# Patient Record
Sex: Male | Born: 1980 | Race: White | Hispanic: No | Marital: Married | Smoking: Never smoker
Health system: Southern US, Community
[De-identification: ages and names within clinical notes are randomized; demographics above are authoritative.]

## PROBLEM LIST (undated history)

## (undated) DIAGNOSIS — C189 Malignant neoplasm of colon, unspecified: Secondary | ICD-10-CM

## (undated) DIAGNOSIS — B070 Plantar wart: Secondary | ICD-10-CM

## (undated) DIAGNOSIS — Z800 Family history of malignant neoplasm of digestive organs: Secondary | ICD-10-CM

## (undated) DIAGNOSIS — M2142 Flat foot [pes planus] (acquired), left foot: Secondary | ICD-10-CM

## (undated) DIAGNOSIS — Z8042 Family history of malignant neoplasm of prostate: Secondary | ICD-10-CM

## (undated) DIAGNOSIS — M546 Pain in thoracic spine: Secondary | ICD-10-CM

## (undated) DIAGNOSIS — M2141 Flat foot [pes planus] (acquired), right foot: Secondary | ICD-10-CM

## (undated) DIAGNOSIS — E785 Hyperlipidemia, unspecified: Secondary | ICD-10-CM

## (undated) DIAGNOSIS — R0982 Postnasal drip: Secondary | ICD-10-CM

## (undated) DIAGNOSIS — Z8041 Family history of malignant neoplasm of ovary: Secondary | ICD-10-CM

## (undated) DIAGNOSIS — F419 Anxiety disorder, unspecified: Secondary | ICD-10-CM

## (undated) HISTORY — DX: Plantar wart: B07.0

## (undated) HISTORY — DX: Postnasal drip: R09.82

## (undated) HISTORY — DX: Family history of malignant neoplasm of digestive organs: Z80.0

## (undated) HISTORY — DX: Family history of malignant neoplasm of prostate: Z80.42

## (undated) HISTORY — DX: Malignant neoplasm of colon, unspecified: C18.9

## (undated) HISTORY — DX: Anxiety disorder, unspecified: F41.9

## (undated) HISTORY — PX: COLON SURGERY: SHX602

## (undated) HISTORY — DX: Hyperlipidemia, unspecified: E78.5

## (undated) HISTORY — DX: Flat foot (pes planus) (acquired), left foot: M21.42

## (undated) HISTORY — DX: Family history of malignant neoplasm of ovary: Z80.41

## (undated) HISTORY — DX: Pain in thoracic spine: M54.6

## (undated) HISTORY — DX: Flat foot (pes planus) (acquired), right foot: M21.41

## (undated) HISTORY — PX: TONSILLECTOMY AND ADENOIDECTOMY: SUR1326

## (undated) HISTORY — PX: COLONOSCOPY: SHX174

---
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
## 2008-07-06 ENCOUNTER — Encounter: Admission: RE | Admit: 2008-07-06 | Discharge: 2008-07-06 | Payer: Self-pay | Admitting: Internal Medicine

## 2020-02-12 ENCOUNTER — Encounter: Payer: Self-pay | Admitting: Emergency Medicine

## 2020-02-15 ENCOUNTER — Ambulatory Visit: Payer: BC Managed Care – PPO | Admitting: Gastroenterology

## 2020-02-15 ENCOUNTER — Encounter: Payer: Self-pay | Admitting: Gastroenterology

## 2020-02-15 ENCOUNTER — Encounter (INDEPENDENT_AMBULATORY_CARE_PROVIDER_SITE_OTHER): Payer: Self-pay

## 2020-02-15 VITALS — BP 144/62 | HR 90 | Temp 97.7°F | Ht 73.0 in | Wt 225.0 lb

## 2020-02-15 DIAGNOSIS — K6289 Other specified diseases of anus and rectum: Secondary | ICD-10-CM

## 2020-02-15 DIAGNOSIS — R198 Other specified symptoms and signs involving the digestive system and abdomen: Secondary | ICD-10-CM

## 2020-02-15 DIAGNOSIS — R194 Change in bowel habit: Secondary | ICD-10-CM | POA: Diagnosis not present

## 2020-02-15 MED ORDER — NA SULFATE-K SULFATE-MG SULF 17.5-3.13-1.6 GM/177ML PO SOLN: 1.0000 | Freq: Once | ORAL | 0 refills | Status: AC

## 2020-02-15 NOTE — Patient Instructions (Signed)
If you are age 37 or older, your body mass index should be between 23-30. Your Body mass index is 29.69 kg/m. If this is out of the aforementioned range listed, please consider follow up with your Primary Care Provider.  If you are age 10 or younger, your body mass index should be between 19-25. Your Body mass index is 29.69 kg/m. If this is out of the aformentioned range listed, please consider follow up with your Primary Care Provider.   You have been scheduled for a colonoscopy. Please follow written instructions given to you at your visit today.  Please pick up your prep supplies at the pharmacy within the next 1-3 days. If you use inhalers (even only as needed), please bring them with you on the day of your procedure.  START Metamucil or Benefiber OTC as directed by the directions on the bottle.

## 2020-02-15 NOTE — Progress Notes (Signed)
Referring Provider: Shon Baton, MD Primary Care Physician:  Shon Baton, MD  Reason for Consultation:  Rectal fullness   IMPRESSION:  Rectal pain/fullness x 1 year despite fiber Altered bowel habits since symptoms developed - sense of incomplete evacuation    - no blood or mucous No known family history of colon cancer or polyps  Anorectal pain/fullness may be caused by hemorrhoids, anal fissures, rectal polyp, rectocele, rectal cancer, inflammatory bowel disease, chronic benign prostatitis, and coccydynia. Colonoscopy recommended for further evaluation given the non-diagnostic rectal exam today.   PLAN: Add a daily stool bulking agent such as Metamucil or Benefiber Colonoscopy  Please see the "Patient Instructions" section for addition details about the plan.  HPI: Thomas Hunt is a 39 y.o. male referred by Dr. Virgina Jock for rectal fullness. The history is obtained through the patient, review of his electronic health record, and records provided by Dr. Virgina Jock. He has hyperlipidemia, mild anxiety, post-nasal drip, mid thoracic back pain. Data Analyst.   One year history of intermittent rectal spasm. Rectal fullness that is uncomfortable but not painful. Associated muscle spasms whenever he is standing still. Does not happen when sitting, lying, or moving. Not associated with defecation. Bowel movements may be more lose than in the past. No blood or mucous in the stool. No abdominal pain.   No other associated symptoms. Intensity over the year has not changed. Symptoms proceed working from home with Covid.   History of hemorrhoids but feels this is different.  Long-standing history of incomplete evacuation. Denies straining.    Saw Dr. Virgina Jock last January for similar symptoms. Has been using OTC hemorrhoid management over the year.   No known family history of colon cancer or polyps. No family history of IBD. No family history of uterine/endometrial cancer, pancreatic cancer or  gastric/stomach cancer.  Labs 09/07/19: normal CMP and CBC except for an RDW of 11.4. TSH 1.45.   Past Medical History:  Diagnosis Date  . Anxiety   . Flat feet   . Hyperlipidemia   . Midline thoracic back pain   . Plantar warts   . Post-nasal drip     Past Surgical History:  Procedure Laterality Date  . TONSILLECTOMY AND ADENOIDECTOMY      No current outpatient medications on file.   No current facility-administered medications for this visit.    Allergies as of 02/15/2020  . (No Known Allergies)    Family History  Problem Relation Age of Onset  . Hypertension Father   . Prostate cancer Father     Social History   Socioeconomic History  . Marital status: Single    Spouse name: Not on file  . Number of children: Not on file  . Years of education: Not on file  . Highest education level: Not on file  Occupational History  . Not on file  Tobacco Use  . Smoking status: Never Smoker  . Smokeless tobacco: Never Used  Substance and Sexual Activity  . Alcohol use: Yes    Comment: occasionally  . Drug use: Never  . Sexual activity: Not on file  Other Topics Concern  . Not on file  Social History Narrative  . Not on file   Social Determinants of Health   Financial Resource Strain:   . Difficulty of Paying Living Expenses:   Food Insecurity:   . Worried About Charity fundraiser in the Last Year:   . Arboriculturist in the Last Year:   Transportation Needs:   .  Lack of Transportation (Medical):   Marland Kitchen Lack of Transportation (Non-Medical):   Physical Activity:   . Days of Exercise per Week:   . Minutes of Exercise per Session:   Stress:   . Feeling of Stress :   Social Connections:   . Frequency of Communication with Friends and Family:   . Frequency of Social Gatherings with Friends and Family:   . Attends Religious Services:   . Active Member of Clubs or Organizations:   . Attends Archivist Meetings:   Marland Kitchen Marital Status:   Intimate Partner  Violence:   . Fear of Current or Ex-Partner:   . Emotionally Abused:   Marland Kitchen Physically Abused:   . Sexually Abused:     Review of Systems: 12 system ROS is negative except as noted above.   Physical Exam: General:   Alert,  well-nourished, pleasant and cooperative in NAD Head:  Normocephalic and atraumatic. Eyes:  Sclera clear, no icterus.   Conjunctiva pink. Ears:  Normal auditory acuity. Nose:  No deformity, discharge,  or lesions. Mouth:  No deformity or lesions.   Neck:  Supple; no masses or thyromegaly. Lungs:  Clear throughout to auscultation.   No wheezes. Heart:  Regular rate and rhythm; no murmurs. Abdomen:  Soft,nontender, nondistended, normal bowel sounds, no rebound or guarding. No hepatosplenomegaly.   Rectal:   No chemical dermatitis. No external hemorrhoids, fissure or fistula. No prolapsing hemorrhoids. No rectal prolapse. Normal anocutaneous reflex. No stool in the rectal vault but some stool particles present at the anus. No mass or fecal impaction. Normal anal resting tone. Chaperone: Estill Bamberg Msk:  Symmetrical. No boney deformities LAD: No inguinal or umbilical LAD Extremities:  No clubbing or edema. Neurologic:  Alert and  oriented x4;  grossly nonfocal Skin:  Intact without significant lesions or rashes. Psych:  Alert and cooperative. Normal mood and affect.    Gentri Guardado L. Tarri Glenn, MD, MPH 02/15/2020, 9:49 AM

## 2020-03-15 ENCOUNTER — Encounter: Payer: Self-pay | Admitting: Gastroenterology

## 2020-03-15 ENCOUNTER — Other Ambulatory Visit: Payer: Self-pay

## 2020-03-15 ENCOUNTER — Ambulatory Visit (AMBULATORY_SURGERY_CENTER): Payer: BC Managed Care – PPO | Admitting: Gastroenterology

## 2020-03-15 ENCOUNTER — Telehealth: Payer: Self-pay

## 2020-03-15 VITALS — BP 123/68 | HR 85 | Temp 95.9°F | Resp 13 | Ht 73.0 in | Wt 225.0 lb

## 2020-03-15 DIAGNOSIS — K6289 Other specified diseases of anus and rectum: Secondary | ICD-10-CM

## 2020-03-15 DIAGNOSIS — C187 Malignant neoplasm of sigmoid colon: Secondary | ICD-10-CM

## 2020-03-15 DIAGNOSIS — D123 Benign neoplasm of transverse colon: Secondary | ICD-10-CM

## 2020-03-15 DIAGNOSIS — D125 Benign neoplasm of sigmoid colon: Secondary | ICD-10-CM | POA: Diagnosis not present

## 2020-03-15 DIAGNOSIS — R194 Change in bowel habit: Secondary | ICD-10-CM

## 2020-03-15 DIAGNOSIS — K6389 Other specified diseases of intestine: Secondary | ICD-10-CM

## 2020-03-15 MED ORDER — SODIUM CHLORIDE 0.9 % IV SOLN: 500.0000 mL | Freq: Once | INTRAVENOUS | Status: DC

## 2020-03-15 NOTE — Op Note (Signed)
[place] Patient Name: Thomas Hunt Procedure Date: 03/15/2020 12:57 PM MRN: [id number] Endoscopist: Thornton Park MD, MD Age: 39 Referring MD:  Date of Birth: [birth year] Gender: Male Account #: [id number] Procedure:                Colonoscopy Indications:              Change in bowel habits                           Rectal pain/fullness x 1 year despite fiber                           Altered bowel habits since symptoms developed -                            sense of incomplete evacuation                           - no blood or mucous                           No known family history of colon cancer or polyps Medicines:                Monitored Anesthesia Care Procedure:                Pre-Anesthesia Assessment:                           - Prior to the procedure, a History and Physical                            was performed, and patient medications and                            allergies were reviewed. The patient's tolerance of                            previous anesthesia was also reviewed. The risks                            and benefits of the procedure and the sedation                            options and risks were discussed with the patient.                            All questions were answered, and informed consent                            was obtained. Prior Anticoagulants: The patient has                            taken no previous anticoagulant or antiplatelet  agents. ASA Grade Assessment: II - A patient with                            mild systemic disease. After reviewing the risks                            and benefits, the patient was deemed in                            satisfactory condition to undergo the procedure.                           After obtaining informed consent, the colonoscope                            was passed under direct vision. Throughout the                            procedure, the  patient's blood pressure, pulse, and                            oxygen saturations were monitored continuously. The                            Colonoscope was introduced through the anus and                            advanced to the 4 cm into the ileum. A second                            forward view of the right colon was performed. The                            colonoscopy was performed without difficulty. The                            patient tolerated the procedure well. The quality                            of the bowel preparation was good. The terminal                            ileum, ileocecal valve, appendiceal orifice, and                            rectum were photographed. Scope In: 1:29:09 PM Scope Out: 1:51:34 PM Scope Withdrawal Time: 0 hours 19 minutes 44 seconds  Total Procedure Duration: 0 hours 22 minutes 25 seconds  Findings:                 The perianal and digital rectal examinations were                            normal.  Non-bleeding internal hemorrhoids were found.                           A 13 mm polyp was found in the sigmoid colon. The                            polyp was pedunculated. The polyp was removed with                            a hot snare. Resection and retrieval were complete.                            Estimated blood loss was minimal.                           A 2 mm polyp was found in the distal transverse                            colon. The polyp was flat. The polyp was removed                            with a cold snare. Resection and retrieval were                            complete. Estimated blood loss was minimal.                           10 discrete focal areas of erythema and mildly                            altered vascularity and were found in the                            descending colon, in the transverse colon, in the                            ascending colon and in the cecum. Biopsies were                             taken with a cold forceps for histology. Estimated                            blood loss was minimal.                           The exam was otherwise without abnormality on                            direct and retroflexion views. Complications:            No immediate complications. Estimated blood loss:                            Minimal. Estimated  Blood Loss:     Estimated blood loss was minimal. Impression:               - Non-bleeding internal hemorrhoids.                           - One 13 mm polyp in the sigmoid colon, removed                            with a hot snare. Resected and retrieved.                           - One 2 mm polyp in the distal transverse colon,                            removed with a cold snare. Resected and retrieved.                           - Altered vascular and erythematous mucosa in the                            descending colon, in the transverse colon, in the                            ascending colon and in the cecum. Biopsied.                           - The examination was otherwise normal on direct                            and retroflexion views. Recommendation:           - Patient has a contact number available for                            emergencies. The signs and symptoms of potential                            delayed complications were discussed with the                            patient. Return to normal activities tomorrow.                            Written discharge instructions were provided to the                            patient.                           - Resume previous diet.                           - Continue present medications including daily  Metamucil.                           - Await pathology results.                           - Repeat colonoscopy date to be determined after                            pending pathology results are reviewed for                             surveillance.                           - Proceed with anorectal manometry to evaluate the                            rectal fullness.                           - Office follow-up after completing manometry. Thornton Park MD, MD 03/15/2020 2:06:51 PM This report has been signed electronically.

## 2020-03-15 NOTE — Patient Instructions (Signed)
Discharge instructions given. Handouts on polyps and hemorrhoids. Proceed with anorectal manometry to evaluate the rectal fullness. Office follow up after completing manometry. YOU HAD AN ENDOSCOPIC PROCEDURE TODAY AT [facility]:   Refer to the procedure report that was given to you for any specific questions about what was found during the examination.  If the procedure report does not answer your questions, please call your gastroenterologist to clarify.  If you requested that your care partner not be given the details of your procedure findings, then the procedure report has been included in a sealed envelope for you to review at your convenience later.  YOU SHOULD EXPECT: Some feelings of bloating in the abdomen. Passage of more gas than usual.  Walking can help get rid of the air that was put into your GI tract during the procedure and reduce the bloating. If you had a lower endoscopy (such as a colonoscopy or flexible sigmoidoscopy) you may notice spotting of blood in your stool or on the toilet paper. If you underwent a bowel prep for your procedure, you may not have a normal bowel movement for a few days.  Please Note:  You might notice some irritation and congestion in your nose or some drainage.  This is from the oxygen used during your procedure.  There is no need for concern and it should clear up in a day or so.  SYMPTOMS TO REPORT IMMEDIATELY:   Following lower endoscopy (colonoscopy or flexible sigmoidoscopy):  Excessive amounts of blood in the stool  Significant tenderness or worsening of abdominal pains  Swelling of the abdomen that is new, acute  Fever of 100F or higher   For urgent or emergent issues, a gastroenterologist can be reached at any hour by calling [phone]. Do not use MyChart messaging for urgent concerns.    DIET:  We do recommend a small meal at first, but then you may proceed to your regular diet.  Drink plenty of fluids but you  should avoid alcoholic beverages for 24 hours.  ACTIVITY:  You should plan to take it easy for the rest of today and you should NOT DRIVE or use heavy machinery until tomorrow (because of the sedation medicines used during the test).    FOLLOW UP: Our staff will call the number listed on your records 48-72 hours following your procedure to check on you and address any questions or concerns that you may have regarding the information given to you following your procedure. If we do not reach you, we will leave a message.  We will attempt to reach you two times.  During this call, we will ask if you have developed any symptoms of COVID 19. If you develop any symptoms (ie: fever, flu-like symptoms, shortness of breath, cough etc.) before then, please call [phone].  If you test positive for Covid 19 in the 2 weeks post procedure, please call and report this information to [place].    If any biopsies were taken you will be contacted by phone or by letter within the next 1-3 weeks.  Please call us at [phone] if you have not heard about the biopsies in 3 weeks.    SIGNATURES/CONFIDENTIALITY: You and/or your care partner have signed paperwork which will be entered into your electronic medical record.  These signatures attest to the fact that that the information above on your After Visit Summary has been reviewed and is understood.  Full responsibility of the confidentiality of this discharge information lies  with you and/or your care-partner.

## 2020-03-15 NOTE — Progress Notes (Signed)
Called to room to assist during endoscopic procedure.  Patient ID and intended procedure confirmed with present staff. Received instructions for my participation in the procedure from the performing physician.  

## 2020-03-15 NOTE — Telephone Encounter (Signed)
-----   Message from Thornton Park, MD sent at 03/15/2020  2:06 PM EDT ----- Please place referral for anorectal manometry: rectal fullness not explained by colonoscopy Needs office visit with me 7-14 days after manometry.  Thank you.  KLB

## 2020-03-15 NOTE — Progress Notes (Signed)
Pt Drowsy. VSS. To PACU, report to RN. No anesthetic complications noted.  

## 2020-03-15 NOTE — Progress Notes (Signed)
JB- temp CW- vitals 

## 2020-03-16 ENCOUNTER — Other Ambulatory Visit: Payer: Self-pay

## 2020-03-16 DIAGNOSIS — K6289 Other specified diseases of anus and rectum: Secondary | ICD-10-CM

## 2020-03-16 NOTE — Telephone Encounter (Signed)
Dear Mr. Westland,  You are scheduled for covid testing at the [facility]-on Saturday May 15 at 10am prior to your procedure at the hospital. You will need to quarantine the days leading up to your procedure.  You have been scheduled to have an anorectal manometry at [facility] on 04/20/20 at 12:30pm. Please arrive 30 minutes prior to your appointment time for registration (1st floor of the hospital-admissions).  Please make certain to use 1 Fleets enema 2 hours prior to coming for your appointment. You can purchase Fleets enemas from the laxative section at your drug store. You should not eat anything during the two hours prior to the procedure. You may take regular medications with small sips of water at least 2 hours prior to the study.  Anorectal manometry is a test performed to evaluate patients with constipation or fecal incontinence. This test measures the pressures of the anal sphincter muscles, the sensation in the rectum, and the neural reflexes that are needed for normal bowel movements.  THE PROCEDURE The test takes approximately 30 minutes to 1 hour. You will be asked to change into a hospital gown. A technician or nurse will explain the procedure to you, take a brief health history, and answer any questions you may have. The patient then lies on his or her left side. A small, flexible tube, about the size of a thermometer, with a balloon at the end is inserted into the rectum. The catheter is connected to a machine that measures the pressure. During the test, the small balloon attached to the catheter may be inflated in the rectum to assess the normal reflex pathways. The nurse or technician may also ask the person to squeeze, relax, and push at various times. The anal sphincter muscle pressures are measured during each of these maneuvers. To squeeze, the patient tightens the sphincter muscles as if trying to prevent anything from coming out. To push  or bear down, the patient strains down as if trying to have a bowel movement.  You are scheduled to see Dr. Tarri Glenn in the office 05/10/20 at 1:50pm.  Sincerely, [facility]   Pt mailed appt letter.

## 2020-03-17 ENCOUNTER — Telehealth: Payer: Self-pay | Admitting: *Deleted

## 2020-03-17 NOTE — Telephone Encounter (Signed)
  Follow up Call-  Call back number 03/15/2020  Post procedure Call Back phone  # [phone]  Permission to leave phone message Yes  Some recent data might be hidden     Patient questions:  Do you have a fever, pain , or abdominal swelling? No. Pain Score  0 *  Have you tolerated food without any problems? Yes.    Have you been able to return to your normal activities? Yes.    Do you have any questions about your discharge instructions: Diet   No. Medications  No. Follow up visit  No.  Do you have questions or concerns about your Care? No.  Actions: * If pain score is 4 or above: No action needed, pain <4.  1. Have you developed a fever since your procedure? no  2.   Have you had an respiratory symptoms (SOB or cough) since your procedure? no  3.   Have you tested positive for COVID 19 since your procedure no  4.   Have you had any family members/close contacts diagnosed with the COVID 19 since your procedure?  no   If yes to any of these questions please route to Joylene John, RN and Erenest Rasher, RN

## 2020-03-17 NOTE — Telephone Encounter (Signed)
  Follow up Call-  Call back number 03/15/2020  Post procedure Call Back phone  # [phone]  Permission to leave phone message Yes  Some recent data might be hidden   [facility]

## 2020-03-22 ENCOUNTER — Other Ambulatory Visit: Payer: Self-pay

## 2020-03-22 DIAGNOSIS — C801 Malignant (primary) neoplasm, unspecified: Secondary | ICD-10-CM

## 2020-03-23 ENCOUNTER — Other Ambulatory Visit: Payer: Self-pay

## 2020-03-23 ENCOUNTER — Encounter: Payer: Self-pay | Admitting: Gastroenterology

## 2020-03-23 ENCOUNTER — Ambulatory Visit (AMBULATORY_SURGERY_CENTER): Payer: BC Managed Care – PPO | Admitting: Gastroenterology

## 2020-03-23 VITALS — BP 125/79 | HR 87 | Temp 96.6°F | Resp 15 | Ht 73.0 in | Wt 225.0 lb

## 2020-03-23 DIAGNOSIS — K635 Polyp of colon: Secondary | ICD-10-CM

## 2020-03-23 DIAGNOSIS — C187 Malignant neoplasm of sigmoid colon: Secondary | ICD-10-CM | POA: Diagnosis not present

## 2020-03-23 DIAGNOSIS — K5520 Angiodysplasia of colon without hemorrhage: Secondary | ICD-10-CM

## 2020-03-23 DIAGNOSIS — K633 Ulcer of intestine: Secondary | ICD-10-CM

## 2020-03-23 DIAGNOSIS — C801 Malignant (primary) neoplasm, unspecified: Secondary | ICD-10-CM

## 2020-03-23 DIAGNOSIS — I420 Dilated cardiomyopathy: Secondary | ICD-10-CM

## 2020-03-23 MED ORDER — SODIUM CHLORIDE 0.9 % IV SOLN: 500.0000 mL | Freq: Once | INTRAVENOUS | Status: DC

## 2020-03-23 NOTE — Progress Notes (Signed)
Pt Drowsy. VSS. To PACU, report to RN. No anesthetic complications noted.  

## 2020-03-23 NOTE — Progress Notes (Signed)
Pt's states no medical or surgical changes since previsit or office visit.  CW vitals, KW IV and LC temp.

## 2020-03-23 NOTE — Op Note (Addendum)
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 Carcinoma present 0.15mm from                            the margins. No lymphovascular invasion.                           Repeat exam recommended to further evaluate the                            polyp base and tattooing. Medicines:                Monitored Anesthesia Care Procedure:                Pre-Anesthesia Assessment:                           - Prior to the procedure, a History and Physical                            was performed, and patient medications and                            allergies were reviewed. The patient's tolerance of                            previous anesthesia was also reviewed. The risks                            and benefits of the procedure and the sedation                            options and risks were discussed with the patient.                            All questions were answered, and informed consent                            was obtained. Prior Anticoagulants: The patient has  taken no previous anticoagulant or antiplatelet                            agents. ASA Grade Assessment: II - A patient with                            mild systemic disease. After reviewing the risks                            and benefits, the patient was deemed in                            satisfactory condition to undergo the procedure.                           After obtaining informed consent,  the scope was                            passed under direct vision. The Colonoscope was                            introduced through the anus and advanced to the the                            left transverse colon. The flexible sigmoidoscopy                            was accomplished without difficulty except for                            looping in the left colon. The patient tolerated                            the procedure well. The quality of the bowel                            preparation was good. Scope In: 10:36:23 AM Scope Out: 10:52:50 AM Total Procedure Duration: 0 hours 16 minutes 27 seconds  Findings:                 The perianal and digital rectal examinations were                            normal.                           Non-bleeding internal hemorrhoids were found. The                            hemorrhoids were small.                           A polypectomy site with associated ulceration was  identified in the distal descending colon. No                            obvious residual polyp or cancer identified. The                            location was 58cm from the anal verge. The margins                            of the ulcer were biopsied for histology. Area was                            tattooed with an injection of 3 mL of Spot (carbon                            black).                           A single small angioectasia without bleeding was                            found in the descending colon.                           The exam was otherwise without abnormality. No                            other mucosal abnormalities identified. Complications:            No immediate complications. Estimated blood loss:                            Minimal. Estimated Blood Loss:     Estimated blood loss was minimal. Impression:               - Non-bleeding internal hemorrhoids.                           - Location of prior polyp with  adenocarcinoma                            identified. Accurate location is the descending                            colon. Biopsied. Tattooed.                           - A single non-bleeding colonic angioectasia.                           - The examination was otherwise normal. Recommendation:           - Discharge patient to home.                           - Resume previous diet.                           -  Continue present medications.                           - Await pathology results.                           - CT of the abd/pelvis with contrast recommended.                           - Consultation with Dr. Dema Severin, [facility]                            Surgery.                           - Consider genetic counseling. All first degree                            family members (children, brothers, sisters, and                            parents) should start colon cancer screening early                            given these findings. Thornton Park MD, MD 03/23/2020 11:12:25 AM This report has been signed electronically.

## 2020-03-23 NOTE — Progress Notes (Signed)
Called to room to assist during endoscopic procedure.  Patient ID and intended procedure confirmed with present staff. Received instructions for my participation in the procedure from the performing physician.  

## 2020-03-23 NOTE — Patient Instructions (Signed)
Please read handouts provided. Continue present medications. Await pathology results. CT of the abd/pelvis with contrast recommended. Consultation with Dr. Dema Severin, [facility]. Consider genetic counseling.     YOU HAD AN ENDOSCOPIC PROCEDURE TODAY AT [facility]:   Refer to the procedure report that was given to you for any specific questions about what was found during the examination.  If the procedure report does not answer your questions, please call your gastroenterologist to clarify.  If you requested that your care partner not be given the details of your procedure findings, then the procedure report has been included in a sealed envelope for you to review at your convenience later.  YOU SHOULD EXPECT: Some feelings of bloating in the abdomen. Passage of more gas than usual.  Walking can help get rid of the air that was put into your GI tract during the procedure and reduce the bloating. If you had a lower endoscopy (such as a colonoscopy or flexible sigmoidoscopy) you may notice spotting of blood in your stool or on the toilet paper. If you underwent a bowel prep for your procedure, you may not have a normal bowel movement for a few days.  Please Note:  You might notice some irritation and congestion in your nose or some drainage.  This is from the oxygen used during your procedure.  There is no need for concern and it should clear up in a day or so.  SYMPTOMS TO REPORT IMMEDIATELY:   Following lower endoscopy (colonoscopy or flexible sigmoidoscopy):  Excessive amounts of blood in the stool  Significant tenderness or worsening of abdominal pains  Swelling of the abdomen that is new, acute  Fever of 100F or higher   For urgent or emergent issues, a gastroenterologist can be reached at any hour by calling [phone]. Do not use MyChart messaging for urgent concerns.    DIET:  We do recommend a small meal at first, but then you may proceed to your  regular diet.  Drink plenty of fluids but you should avoid alcoholic beverages for 24 hours.  ACTIVITY:  You should plan to take it easy for the rest of today and you should NOT DRIVE or use heavy machinery until tomorrow (because of the sedation medicines used during the test).    FOLLOW UP: Our staff will call the number listed on your records 48-72 hours following your procedure to check on you and address any questions or concerns that you may have regarding the information given to you following your procedure. If we do not reach you, we will leave a message.  We will attempt to reach you two times.  During this call, we will ask if you have developed any symptoms of COVID 19. If you develop any symptoms (ie: fever, flu-like symptoms, shortness of breath, cough etc.) before then, please call [phone].  If you test positive for Covid 19 in the 2 weeks post procedure, please call and report this information to [place].    If any biopsies were taken you will be contacted by phone or by letter within the next 1-3 weeks.  Please call us at [phone] if you have not heard about the biopsies in 3 weeks.    SIGNATURES/CONFIDENTIALITY: You and/or your care partner have signed paperwork which will be entered into your electronic medical record.  These signatures attest to the fact that that the information above on your After Visit Summary has been reviewed and is understood.  Full responsibility  of the confidentiality of this discharge information lies with you and/or your care-partner.

## 2020-03-24 ENCOUNTER — Telehealth: Payer: Self-pay

## 2020-03-24 ENCOUNTER — Other Ambulatory Visit: Payer: Self-pay

## 2020-03-24 DIAGNOSIS — C801 Malignant (primary) neoplasm, unspecified: Secondary | ICD-10-CM

## 2020-03-24 NOTE — Telephone Encounter (Signed)
Pt scheduled for CT of A/P at [facility] 03/30/20@1 :30pm, pt to arrive there at 1:15pm and be NPO after 9:30am. Pt to drink bottle 1 of contrast at 11:30am and bottle 2 T 12:30pm. Pt aware of appt and instructions.

## 2020-03-24 NOTE — Telephone Encounter (Signed)
Referral faxed to [facility] for an appt with Dr. Dema Severin.

## 2020-03-25 ENCOUNTER — Telehealth: Payer: Self-pay

## 2020-03-25 NOTE — Telephone Encounter (Signed)
  Follow up Call-  Call back number 03/23/2020 03/15/2020  Post procedure Call Back phone  # [phone]  Permission to leave phone message Yes Yes  Some recent data might be hidden     Patient questions:  Do you have a fever, pain , or abdominal swelling? No. Pain Score  0   Have you tolerated food without any problems? Yes.    Have you been able to return to your normal activities? Yes.    Do you have any questions about your discharge instructions: Diet   No. Medications  No. Follow up visit  No.  Do you have questions or concerns about your Care? No.  Actions: * If pain score is 4 or above: No action needed, pain <4.  1. Have you developed a fever since your procedure? no  2.   Have you had an respiratory symptoms (SOB or cough) since your procedure? no  3.   Have you tested positive for COVID 19 since your procedure no  4.   Have you had any family members/close contacts diagnosed with the COVID 19 since your procedure?  no   If yes to any of these questions please route to Joylene John, RN and Erenest Rasher, RN

## 2020-03-26 HISTORY — PX: SIGMOIDOSCOPY: SHX6686

## 2020-03-28 ENCOUNTER — Other Ambulatory Visit: Payer: BC Managed Care – PPO | Admitting: Gastroenterology

## 2020-03-30 ENCOUNTER — Ambulatory Visit (INDEPENDENT_AMBULATORY_CARE_PROVIDER_SITE_OTHER)
Admission: RE | Admit: 2020-03-30 | Discharge: 2020-03-30 | Disposition: A | Discharge status: Home / Self Care | Payer: BC Managed Care – PPO | Source: Ambulatory Visit | Attending: Gastroenterology | Admitting: Gastroenterology

## 2020-03-30 ENCOUNTER — Other Ambulatory Visit: Payer: Self-pay

## 2020-03-30 DIAGNOSIS — C801 Malignant (primary) neoplasm, unspecified: Secondary | ICD-10-CM

## 2020-03-30 MED ORDER — IOHEXOL 300 MG/ML  SOLN
100.0000 mL | Freq: Once | INTRAMUSCULAR | Status: AC | PRN
  Administered 2020-03-30: 100 mL via INTRAVENOUS

## 2020-04-07 ENCOUNTER — Other Ambulatory Visit: Payer: Self-pay | Admitting: Surgery

## 2020-04-07 DIAGNOSIS — C801 Malignant (primary) neoplasm, unspecified: Secondary | ICD-10-CM

## 2020-04-08 ENCOUNTER — Telehealth: Payer: Self-pay

## 2020-04-08 NOTE — Telephone Encounter (Signed)
Recall in epic for colon in 1 year.

## 2020-04-11 ENCOUNTER — Telehealth: Payer: Self-pay | Admitting: Genetic Counselor

## 2020-04-11 NOTE — Telephone Encounter (Signed)
Received a genetic counseling referral from Dr. Dema Severin at [facility] for colon cancer. Thomas Hunt has been cld and scheduled to see Roma Kayser on 5/17 at 2pm. Pt aware to arrive 15 minutes early.

## 2020-04-14 ENCOUNTER — Ambulatory Visit: Payer: Self-pay | Admitting: Surgery

## 2020-04-14 NOTE — H&P (Signed)
CC: Referred by Dr. Tarri Hunt for malignant polyp  HPI: Thomas Hunt is a very pleasant 39yoM who has a couple year history of vague abdominal/pelvic complaints. He reports ongoing issues with some degree of rectal fullness and incomplete defecation as well as a sense of a muscle twitching in his pelvic floor. He had been seen in gastroenterology, Dr. Tarri Hunt and ultimately had a colonoscopy done. He denied any history of rectal bleeding. Colonoscopy is completed 03/15/20 and demonstrated a 13 mm polyp in the sigmoid colon that was semi-pedunculated and removed with a hot snare. Resection was complete. A 2 mm polyp in the distal transverse colon removed and additionally, 10 discrete focal areas of erythema and mildly altered vascularity were seen and biopsied. 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 Transverse colon polyp return as a tubular adenoma. Biopsies showed mucosa with focus of ectatic vascular channels, mostly capillaries. Carcinoma measured 4 mm. There is no lymphovascular invasion noted. Carcinoma is present approximately 0.5 mm from the inked margin. IHC/mismatch repair protein normal. He had a subsequent CT abdomen and pelvis which demonstrated no evidence of metastatic disease. He has not completed a CT scan of his chest. He has not had a genetics referral. He had a repeat flexible sigmoidoscopy done 03/23/20 where the lesion was seen as an ulcer at 58 cm from anal verge and was tattooed. Biopsies were taken and returned normal. He denies any complaints today. He denies abdominal pain. He denies any blood in stool.  PMH: Denies  PSH: Denies  FHx: Denies FHx of colorectal, breast, endometrial, ovarian or cervical cancer  Social: Denies use of tobacco/drugs; occasional social EtOH use  ROS: A comprehensive 10 system review of systems was completed with the patient and  pertinent findings as noted above.  The patient is a 39 year old male.   Past Surgical History Thomas Hunt, Hunt; 04/05/2020 9:08 AM) Colon Polyp Removal - Colonoscopy  Tonsillectomy   Diagnostic Studies History Thomas Hunt, Hunt; 04/05/2020 9:08 AM) Colonoscopy  within last year  Allergies Thomas Hunt, Hunt; 04/05/2020 9:08 AM) No Known Drug Allergies  [04/05/2020]: Allergies Reconciled   Medication History Thomas Hunt; 04/05/2020 9:08 AM) No Current Medications Medications Reconciled  Social History Thomas Hunt, Hunt; 04/05/2020 9:08 AM) Alcohol use  Occasional alcohol use. Caffeine use  Carbonated beverages, Coffee, Tea. No drug use  Tobacco use  Never smoker.  Family History Thomas Hunt, Hunt; 04/05/2020 9:08 AM) Prostate Cancer  Father.  Other Problems Thomas Hunt, Hunt; 04/05/2020 9:08 AM) Anxiety Disorder  Colon Cancer  Hemorrhoids     Review of Systems (Thomas Hunt Hunt; 04/05/2020 9:08 AM) General Not Present- Appetite Loss, Chills, Fatigue, Fever, Night Sweats, Weight Gain and Weight Loss. Skin Not Present- Change in Wart/Mole, Dryness, Hives, Jaundice, New Lesions, Non-Healing Wounds, Rash and Ulcer. HEENT Not Present- Earache, Hearing Loss, Hoarseness, Nose Bleed, Oral Ulcers, Ringing in the Ears, Seasonal Allergies, Sinus Pain, Sore Throat, Visual Disturbances, Wears glasses/contact lenses and Yellow Eyes. Respiratory Not Present- Bloody sputum, Chronic Cough, Difficulty Breathing, Snoring and Wheezing. Breast Not Present- Breast Mass, Breast Pain, Nipple Discharge and Skin Changes. Cardiovascular Not Present- Chest Pain, Difficulty Breathing Lying Down, Leg Cramps, Palpitations, Rapid Heart Rate, Shortness of Breath and Swelling of Extremities. Gastrointestinal Not Present- Abdominal Pain, Bloating, Bloody Stool, Change in Bowel Habits, Chronic diarrhea, Constipation, Difficulty Swallowing, Excessive  gas, Gets full quickly at meals, Hemorrhoids, Indigestion, Nausea, Rectal Pain and Vomiting. Male  Genitourinary Not Present- Blood in Urine, Change in Urinary Stream, Frequency, Impotence, Nocturia, Painful Urination, Urgency and Urine Leakage. Musculoskeletal Not Present- Back Pain, Joint Pain, Joint Stiffness, Muscle Pain, Muscle Weakness and Swelling of Extremities. Neurological Not Present- Decreased Memory, Fainting, Headaches, Numbness, Seizures, Tingling, Tremor, Trouble walking and Weakness. Psychiatric Not Present- Anxiety, Bipolar, Change in Sleep Pattern, Depression, Fearful and Frequent crying. Endocrine Not Present- Cold Intolerance, Excessive Hunger, Hair Changes, Heat Intolerance, Hot flashes and New Diabetes. Hematology Not Present- Blood Thinners, Easy Bruising, Excessive bleeding, Gland problems, HIV and Persistent Infections.  Vitals Thomas Hunt; 04/05/2020 9:08 AM) 04/05/2020 9:08 AM Weight: 217.6 lb Height: 73in Body Surface Area: 2.23 m Body Mass Index: 28.71 kg/m  Temp.: 98.65F (Temporal)  Pulse: 99 (Regular)  P.OX: 99% (Room air) BP: 132/80(Sitting, Right Arm, Standard)       Physical Exam Thomas Gave M. White MD; 04/05/2020 9:44 AM) The physical exam findings are as follows: Note: Constitutional: No acute distress; conversant; no deformities; wearing mask Eyes: Moist conjunctiva; no lid lag; anicteric sclerae; pupils equal and round Neck: Trachea midline; no palpable thyromegaly Lungs: Normal respiratory effort; no tactile fremitus CV: rrr; no palpable thrill; no pitting edema GI: Abdomen soft, nontender, nondistended; no palpable hepatosplenomegaly MSK: Normal gait; no clubbing/cyanosis Psychiatric: Appropriate affect; alert and oriented 3 Lymphatic: No palpable cervical or axillary lymphadenopathy    Assessment & Plan Thomas Gave M. White MD; 04/05/2020 9:50 AM) ADENOCARCINOMA (C80.1) Current Plans CT CHEST W/ AND W/O CONTRAST  (76811) Referred to Genetic Counseling, for evaluation and follow up Thomas Hunt). Routine. COLON CANCER (C18.9) Story: Thomas Hunt is a very pleasant 62yoM with recently removed polyp from colon - presumed sigmoid - returned well differentiated adenocarcinoma with 0.5 mm margin; no LVI.  CT A/P 03/30/20 - no evidence of metastatic disease  -The anatomy and physiology of the GI tract was discussed at length with the patient. The pathophysiology of colon polyps and cancer was discussed at length with associated pictures. -We spent time going over his pathology with him today. We discussed that a CT scan of the chest negative completed as well as a genetics referral.  -We discussed that with a margin of 0.5 mm, risk of recurrence/relapse being between approximately 20 and 34% depending on the study. We discussed that with negative biopsy results from the flexible sigmoidoscopy, this being somewhat uncharted territory. We did review recommendation for surgery going forward given the fact that the margin was so close and if this were to "recur" (persist), by the time it is endoscopically apparent, high risks for lymphatic spread and subsequent metastases. We therefore also spent time today reviewing segmental colectomy to address the area of concern. Impression: We discussed that this may involve a sigmoid colectomy or left hemicolectomy depending on where the tattoo was located intraoperatively. We discussed minimally invasive/robotic and potential open techniques. -We also discussed that if he were to have a genetics panel returned positive for a colon cancer such as leg syndrome options for prophylactic surgery including total abdominal colectomy. He is not interested in pursuing any sort of "prophylactic surgery" acknowledges potential risks therein; intense surveillance. -The procedure, material risks (including, but not limited to, pain, bleeding, infection, scarring, need for blood transfusion,  damage to surrounding structures- blood vessels/nerves/viscus/organs, damage to ureter, urine leak, leak from anastomosis, need for additional procedures, medication reactions, worsening of pre-existing medical conditions, need for stoma which can be permanent, hernia, recurrence, DVT/PE, pneumonia, heart attack, stroke, death) benefits and alternatives to surgery were  discussed at length. The patient's questions were answered to his satisfaction, he voiced understanding. Additionally, we discussed typical postoperative expectations and the recovery process. -He is planning to speak with his wife and let us know if next week what his decision is  This patient encounter took 52 minutes today to perform the following: take history, perform exam, review outside records, interpret imaging, counsel the patient on their diagnosis and document encounter, findings & plan in the EHR  Signed by Ileana Roup, MD (04/05/2020 9:50 AM)

## 2020-04-16 ENCOUNTER — Other Ambulatory Visit (HOSPITAL_COMMUNITY): Payer: BC Managed Care – PPO

## 2020-04-18 ENCOUNTER — Inpatient Hospital Stay: Payer: BC Managed Care – PPO | Attending: Genetic Counselor | Admitting: Genetic Counselor

## 2020-04-18 ENCOUNTER — Encounter: Payer: Self-pay | Admitting: Genetic Counselor

## 2020-04-18 ENCOUNTER — Other Ambulatory Visit: Payer: Self-pay | Admitting: Genetic Counselor

## 2020-04-18 ENCOUNTER — Other Ambulatory Visit: Payer: Self-pay

## 2020-04-18 ENCOUNTER — Inpatient Hospital Stay: Payer: BC Managed Care – PPO

## 2020-04-18 DIAGNOSIS — Z1379 Encounter for other screening for genetic and chromosomal anomalies: Secondary | ICD-10-CM

## 2020-04-18 DIAGNOSIS — C189 Malignant neoplasm of colon, unspecified: Secondary | ICD-10-CM

## 2020-04-18 DIAGNOSIS — Z800 Family history of malignant neoplasm of digestive organs: Secondary | ICD-10-CM

## 2020-04-18 DIAGNOSIS — Z8041 Family history of malignant neoplasm of ovary: Secondary | ICD-10-CM

## 2020-04-18 DIAGNOSIS — Z8042 Family history of malignant neoplasm of prostate: Secondary | ICD-10-CM

## 2020-04-18 LAB — GENETIC SCREENING ORDER

## 2020-04-18 NOTE — Progress Notes (Signed)
REFERRING PROVIDER: Ileana Roup, MD [address] [place],  [place] [address]  PRIMARY PROVIDER:  Shon Baton, MD  PRIMARY REASON FOR VISIT:  1. Family history of colon cancer   2. Family history of prostate cancer   3. Family history of ovarian cancer   4. Malignant neoplasm of colon, unspecified part of colon ([place])      HISTORY OF PRESENT ILLNESS:   [name], a 39 y.o. male, was seen for a [facility] cancer genetics consultation at the request of Dr. Dema Severin due to a personal and family history of cancer.  [name] presents to clinic today to discuss the possibility of a hereditary predisposition to cancer, genetic testing, and to further clarify his future cancer risks, as well as potential cancer risks for family members.   In April 2021, at the age of 4, Mr. Hunt was diagnosed with adenocarcinoma of the colon. The treatment plan polyp removal, as the cancer was in the polyp.  He had a lower body CT scan that was normal and is scheduled for an upper body CT next week.      CANCER HISTORY:  Oncology History   No history exists.      Past Medical History:  Diagnosis Date  . Anxiety   . Colon cancer ([place])   . Family history of colon cancer   . Family history of ovarian cancer   . Family history of prostate cancer   . Flat feet   . Hyperlipidemia   . Midline thoracic back pain   . Plantar warts   . Post-nasal drip     Past Surgical History:  Procedure Laterality Date  . TONSILLECTOMY AND ADENOIDECTOMY      Social History   Socioeconomic History  . Marital status: Single    Spouse name: Not on file  . Number of children: Not on file  . Years of education: Not on file  . Highest education level: Not on file  Occupational History  . Not on file  Tobacco Use  . Smoking status: Never Smoker  . Smokeless tobacco: Never Used  Substance and Sexual Activity  . Alcohol use: Yes    Comment: occasionally  . Drug use: Never  . Sexual activity: Not on  file  Other Topics Concern  . Not on file  Social History Narrative  . Not on file   Social Determinants of Health   Financial Resource Strain:   . Difficulty of Paying Living Expenses:   Food Insecurity:   . Worried About Charity fundraiser in the Last Year:   . Arboriculturist in the Last Year:   Transportation Needs:   . Film/video editor (Medical):   Marland Kitchen Lack of Transportation (Non-Medical):   Physical Activity:   . Days of Exercise per Week:   . Minutes of Exercise per Session:   Stress:   . Feeling of Stress :   Social Connections:   . Frequency of Communication with Friends and Family:   . Frequency of Social Gatherings with Friends and Family:   . Attends Religious Services:   . Active Member of Clubs or Organizations:   . Attends Archivist Meetings:   Marland Kitchen Marital Status:      FAMILY HISTORY:  We obtained a detailed, 4-generation family history.  Significant diagnoses are listed below: Family History  Problem Relation Age of Onset  . Hypertension Father   . Prostate cancer Father 60  . Colonic polyp  Mother   . Prostate cancer Maternal Grandfather 28  . Lung cancer Maternal Grandfather 61       smoker  . Ovarian cancer Paternal Grandmother        fallopian tube cancer  . Prostate cancer Paternal Grandfather        d. 30  . Colon cancer Other        MGF's brother dx over 83  . Stomach cancer Neg Hx   . Rectal cancer Neg Hx   . Diabetes Neg Hx     The patient has a young son and daughter who are cancer free.  He has a sister who is cancer free.  Both parents are living and do not have cancer.  The patient's mother is 67 and has had a colonoscopy that found 1-2 polyps.  She has four brothers who are cancer free.  Her parents are deceased.  Her father had prostate and lung cancer and his brother had colon cancer over 18.  The patient's father had prostate cancer at 28.  He is an only child.  His mother had fallopian tube cancer and his father  also had prostate cancer.  [name] is unaware of previous family history of genetic testing for hereditary cancer risks. Patient's maternal ancestors are of Caucasian descent, and paternal ancestors are of Caucasian descent. There is no reported Ashkenazi Jewish ancestry. There is no known consanguinity.  GENETIC COUNSELING ASSESSMENT: [name] is a 39 y.o. male with a personal and family history of cancer which is somewhat suggestive of a hereditary cancer syndrome, such as Lynch syndrome, and predisposition to cancer given his young age of onset and the combination of cancer in the family. We, therefore, discussed and recommended the following at today's visit.   DISCUSSION: We discussed that 5 - 7% of colon cancer is hereditary, with most cases associated with Lynch syndrome.  There are other genes that can be associated with hereditary colon cancer syndromes.  These include APC, MUTYH and others.  We discussed that testing is beneficial for several reasons including knowing how to follow individuals after completing their treatment, identifying whether potential treatment options such as systemic therapy if needed would be beneficial, and understand if other family members could be at risk for cancer and allow them to undergo genetic testing.   We reviewed the characteristics, features and inheritance patterns of hereditary cancer syndromes. We also discussed genetic testing, including the appropriate family members to test, the process of testing, insurance coverage and turn-around-time for results. We discussed the implications of a negative, positive, carrier and/or variant of uncertain significant result. We recommended Mr. Droge pursue genetic testing for the common hereditary cancer panel and preliminary colon cancer gene panel.   Based on [name] personal and family history of cancer, he meets medical criteria for genetic testing. Despite that he meets criteria, he may still have an  out of pocket cost. We discussed that if his out of pocket cost for testing is over $100, the laboratory will call and confirm whether he wants to proceed with testing.  If the out of pocket cost of testing is less than $100 he will be billed by the genetic testing laboratory.   PLAN: After considering the risks, benefits, and limitations, Mr. Plumber provided informed consent to pursue genetic testing and the blood sample was sent to [facility] for analysis of the common hereditary cancer panel and the preliminary colon cancer panel. Results should be available within approximately 2-3 weeks'  time, at which point they will be disclosed by telephone to Mr. Haataja, as will any additional recommendations warranted by these results. Mr. Neudecker will receive a summary of his genetic counseling visit and a copy of his results once available. This information will also be available in Epic.   Lastly, we encouraged Mr. Woulard to remain in contact with cancer genetics annually so that we can continuously update the family history and inform him of any changes in cancer genetics and testing that may be of benefit for this family.   Mr. Southards questions were answered to his satisfaction today. Our contact information was provided should additional questions or concerns arise. Thank you for the referral and allowing [place] to share in the care of your patient.   Adira Limburg P. Florene Glen, [place], [facility] Licensed, Insurance risk surveyor [email or web address] phone: [phone]  The patient was seen for a total of 45 minutes in face-to-face genetic counseling.  This patient was discussed with Drs. Magrinat, Lindi Adie and/or Burr Medico who agrees with the above.    _______________________________________________________________________ For Office Staff:  Number of people involved in session: 1 Was an Intern/ student involved with case: no

## 2020-04-20 ENCOUNTER — Encounter (HOSPITAL_COMMUNITY): Payer: Self-pay

## 2020-04-20 ENCOUNTER — Ambulatory Visit (HOSPITAL_COMMUNITY): Admit: 2020-04-20 | Payer: BC Managed Care – PPO | Admitting: Gastroenterology

## 2020-04-20 SURGERY — MANOMETRY, ANORECTAL
Anesthesia: LOCAL

## 2020-04-22 ENCOUNTER — Other Ambulatory Visit: Payer: Self-pay

## 2020-04-22 ENCOUNTER — Ambulatory Visit
Admission: RE | Admit: 2020-04-22 | Discharge: 2020-04-22 | Disposition: A | Discharge status: Home / Self Care | Payer: BC Managed Care – PPO | Source: Ambulatory Visit | Attending: Surgery | Admitting: Surgery

## 2020-04-22 DIAGNOSIS — C801 Malignant (primary) neoplasm, unspecified: Secondary | ICD-10-CM

## 2020-04-22 MED ORDER — IOPAMIDOL (ISOVUE-300) INJECTION 61%
75.0000 mL | Freq: Once | INTRAVENOUS | Status: AC | PRN
  Administered 2020-04-22: 75 mL via INTRAVENOUS

## 2020-04-27 ENCOUNTER — Encounter: Payer: Self-pay | Admitting: Genetic Counselor

## 2020-04-27 ENCOUNTER — Ambulatory Visit: Payer: Self-pay | Admitting: Genetic Counselor

## 2020-04-27 ENCOUNTER — Telehealth: Payer: Self-pay | Admitting: Genetic Counselor

## 2020-04-27 DIAGNOSIS — Z1379 Encounter for other screening for genetic and chromosomal anomalies: Secondary | ICD-10-CM | POA: Insufficient documentation

## 2020-04-27 NOTE — Telephone Encounter (Signed)
Revealed negative genetic testing.  Discussed that we do not know why he has colon cancer or why there is cancer in the family. It could be due to a different gene that we are not testing, or maybe our current technology may not be able to pick something up.  It will be important for him to keep in contact with genetics to keep up with whether additional testing may be needed.   One RAD50 VUS was identified.  This will not change medical management.

## 2020-04-28 NOTE — Progress Notes (Signed)
HPI:  Mr. Crum was previously seen in the [place] clinic due to a personal and family history of cancer and concerns regarding a hereditary predisposition to cancer. Please refer to our prior cancer genetics clinic note for more information regarding our discussion, assessment and recommendations, at the time. Mr. Adell recent genetic test results were disclosed to him, as were recommendations warranted by these results. These results and recommendations are discussed in more detail below.  CANCER HISTORY:  Oncology History  Colon cancer [facility])   Initial Diagnosis   Colon cancer ([place])   04/26/2020 Genetic Testing   Negative genetic testing on a 56 gene panel.  RAD50 VUS identified on the panel.  The custom gene Panel offered by [facility] includes sequencing and/or deletion duplication testing of the following 56 genes: APC*, ATM*, AXIN2, BARD1, BLM, BMPR1A, BRCA1, BRCA2, BRIP1, BUB1B, CDH1, CDK4, CDKN2A (p14ARF), CDKN2A (p16INK4a), CEP57*, CHEK2, CTNNA1, DICER1*, ENG*, EPCAM*, FLCN, GALNT12, GREM1*, HOXB13, KIT, MEN1*, MLH1*, MLH3*, MSH2*, MSH3*, MSH6*, MUTYH, NBN, NF1*, NTHL1, PALB2, PDGFRA, PMS2*, POLD1*, POLE, PTEN*, RAD50, RAD51C, RAD51D, RNF43, RPS20, SDHA*, SDHB, SDHC*, SDHD, SMAD4, SMARCA4, STK11, TP53, TSC1*, TSC2, and VHL The report date is Apr 26, 2020.     FAMILY HISTORY:  We obtained a detailed, 4-generation family history.  Significant diagnoses are listed below: Family History  Problem Relation Age of Onset  . Hypertension Father   . Prostate cancer Father 52  . Colonic polyp Mother   . Prostate cancer Maternal Grandfather 46  . Lung cancer Maternal Grandfather 82       smoker  . Ovarian cancer Paternal Grandmother        fallopian tube cancer  . Prostate cancer Paternal Grandfather        d. 11  . Colon cancer Other        MGF's brother dx over 40  . Stomach cancer Neg Hx   . Rectal cancer Neg Hx   . Diabetes Neg Hx     The patient has a young son  and daughter who are cancer free.  He has a sister who is cancer free.  Both parents are living and do not have cancer.  The patient's mother is 25 and has had a colonoscopy that found 1-2 polyps.  She has four brothers who are cancer free.  Her parents are deceased.  Her father had prostate and lung cancer and his brother had colon cancer over 75.  The patient's father had prostate cancer at 74.  He is an only child.  His mother had fallopian tube cancer and his father also had prostate cancer.  Mr. Samples is unaware of previous family history of genetic testing for hereditary cancer risks. Patient's maternal ancestors are of Caucasian descent, and paternal ancestors are of Caucasian descent. There is no reported Ashkenazi Jewish ancestry. There is no known consanguinity.    GENETIC TEST RESULTS: Genetic testing reported out on Apr 26, 3030 through the common hereditary and preliminary colorectal cancer panel found no pathogenic mutations. The custom gene Panel offered by [facility] includes sequencing and/or deletion duplication testing of the following 56 genes: APC*, ATM*, AXIN2, BARD1, BLM, BMPR1A, BRCA1, BRCA2, BRIP1, BUB1B, CDH1, CDK4, CDKN2A (p14ARF), CDKN2A (p16INK4a), CEP57*, CHEK2, CTNNA1, DICER1*, ENG*, EPCAM*, FLCN, GALNT12, GREM1*, HOXB13, KIT, MEN1*, MLH1*, MLH3*, MSH2*, MSH3*, MSH6*, MUTYH, NBN, NF1*, NTHL1, PALB2, PDGFRA, PMS2*, POLD1*, POLE, PTEN*, RAD50, RAD51C, RAD51D, RNF43, RPS20, SDHA*, SDHB, SDHC*, SDHD, SMAD4, SMARCA4, STK11, TP53, TSC1*, TSC2, and VHL. The test report has  been scanned into EPIC and is located under the Molecular Pathology section of the Results Review tab.  A portion of the result report is included below for reference.     We discussed with Mr. Careaga that because current genetic testing is not perfect, it is possible there may be a gene mutation in one of these genes that current testing cannot detect, but that chance is small.  We also discussed, that there  could be another gene that has not yet been discovered, or that we have not yet tested, that is responsible for the cancer diagnoses in the family. It is also possible there is a hereditary cause for the cancer in the family that Mr. Geller did not inherit and therefore was not identified in his testing.  Therefore, it is important to remain in touch with cancer genetics in the future so that we can continue to offer Mr. Ramseyer the most up to date genetic testing.   Genetic testing did identify a variant of uncertain significance (VUS) was identified in the RAD50 gene called c.[id number].  At this time, it is unknown if this variant is associated with increased cancer risk or if this is a normal finding, but most variants such as this get reclassified to being inconsequential. It should not be used to make medical management decisions. With time, we suspect the lab will determine the significance of this variant, if any. If we do learn more about it, we will try to contact Mr. Welty to discuss it further. However, it is important to stay in touch with [place] periodically and keep the address and phone number up to date.  ADDITIONAL GENETIC TESTING: We discussed with Mr. Morss that there are other genes that are associated with increased cancer risk that can be analyzed. Should Mr. Montano wish to pursue additional genetic testing, we are happy to discuss and coordinate this testing, at any time.    CANCER SCREENING RECOMMENDATIONS: Mr. Arganbright test result is considered negative (normal).  This means that we have not identified a hereditary cause for his personal and family history of cancer at this time. Most cancers happen by chance and this negative test suggests that his cancer may fall into this category.    While reassuring, this does not definitively rule out a hereditary predisposition to cancer. It is still possible that there could be genetic mutations that are undetectable by current technology. There  could be genetic mutations in genes that have not been tested or identified to increase cancer risk.  Therefore, it is recommended he continue to follow the cancer management and screening guidelines provided by his oncology and primary healthcare provider.   An individual's cancer risk and medical management are not determined by genetic test results alone. Overall cancer risk assessment incorporates additional factors, including personal medical history, family history, and any available genetic information that may result in a personalized plan for cancer prevention and surveillance  RECOMMENDATIONS FOR FAMILY MEMBERS:  Individuals in this family might be at some increased risk of developing cancer, over the general population risk, simply due to the family history of cancer.  We recommended women in this family have a yearly mammogram beginning at age 44, or 40 years younger than the earliest onset of cancer, an annual clinical breast exam, and perform monthly breast self-exams. Women in this family should also have a gynecological exam as recommended by their primary provider. All family members should have a colonoscopy by age 110.  FOLLOW-UP: Lastly, we  discussed with Mr. Ing that cancer genetics is a rapidly advancing field and it is possible that new genetic tests will be appropriate for him and/or his family members in the future. We encouraged him to remain in contact with cancer genetics on an annual basis so we can update his personal and family histories and let him know of advances in cancer genetics that may benefit this family.   Our contact number was provided. Mr. Lipford questions were answered to his satisfaction, and he knows he is welcome to call us at anytime with additional questions or concerns.   Roma Kayser, [place], [facility] Licensed, Certified Genetic Counselor [email or web address]

## 2020-05-10 ENCOUNTER — Ambulatory Visit: Payer: BC Managed Care – PPO | Admitting: Gastroenterology

## 2020-06-13 NOTE — Progress Notes (Addendum)
COVID Vaccine Completed: x2 Date COVID Vaccine completed: COVID vaccine manufacturer: [place]   PCP - Dr. Shon Baton Cardiologist -   No Back Stimulator  Chest x-ray -  EKG -  Stress Test -  ECHO -  Cardiac Cath -   Sleep Study -  CPAP -   Fasting Blood Sugar -  Checks Blood Sugar _____ times a day  Blood Thinner Instructions: Aspirin Instructions: Last Dose:  Anesthesia review:   Patient denies shortness of breath, fever, cough and chest pain at PAT appointment  No SOB with ADL's  Patient verbalized understanding of instructions that were given to them at the PAT appointment. Patient was also instructed that they will need to review over the PAT instructions again at home before surgery.

## 2020-06-13 NOTE — Patient Instructions (Addendum)
DUE TO COVID-19 ONLY ONE VISITOR ARE ALLOWED TO COME WITH YOU AND STAY IN THE WAITING ROOM ONLY DURING PRE OP AND  PROCEDURE. THEN TWO VISITORS MAY VISIT WITH YOU IN YOUR PRIVATE ROOM DURING VISITING HOURS ONLY!!   COVID SWAB TESTING MUST BE COMPLETED ON:     06-21-20 @3 :00 PM [address], [place] -[facility] enter pre surgical testing line (Must self quarantine after testing. Follow instructions on handout.)            Your procedure is scheduled on:  06-24-20   Report to [facility] Main  Entrance   Report to Short Stay at 5:30 AM   [facility])    Call this number if you have problems the morning of surgery [phone]   Please start a Clear Liquid Diet along with your bowel prep on 06-23-20. DRINK 2 PRESURGERY ENSURE DRINKS THE NIGHT BEFORE SURGERY AT 1000 PM AND 1 PRESURGERY DRINK THE DAY OF THE PROCEDURE 3 HOURS PRIOR TO SCHEDULED SURGERY.  CONTINUE YOUR CLEAR LIQUIDS UNTIL YOU COMPLETE YOUR PRESURGERY ENSURE DRINK AT 4:30 AM. AFTER 4:30 AM,  NOTHING UNTIL AFTER SURGERY.      CLEAR LIQUID DIET  Foods Allowed                                                                     Foods Excluded  Water, Black Coffee and tea, regular and decaf            liquids that you cannot  Plain Jell-O in any flavor  (No red)                                   see through such as: Fruit ices (not with fruit pulp)                                           milk, soups, orange juice  Iced Popsicles (No red)                                           All solid food                                   Apple juices Sports drinks like Gatorade (No red) Lightly seasoned clear broth or consume(fat free) Sugar, honey syrup  Sample Menu Breakfast                                Lunch                                     Supper Cranberry juice  Beef broth                            Chicken broth Jell-O                                     Grape juice                            Apple juice Coffee or tea                        Jell-O                                      Popsicle                                                Coffee or tea                        Coffee or tea[facility] -     Take these medicines the morning of surgery with A SIP OF WATER: None  Oral Hygiene is also important to reduce your risk of infection.                                     Remember - BRUSH YOUR TEETH THE MORNING OF SURGERY WITH YOUR REGULAR TOOTHPASTE. AFTER THAT, NO CANDY, NO WATER, GUM OR MINTS.                               You may not have any metal on your body including jewelry, and body piercings               Do not wear lotions, powders, cologne, or deodorant                        Men may shave face and neck.   Do not bring valuables to the hospital. [place].   Contacts, dentures or bridgework may not be worn into surgery.   Bring small overnight bag day of surgery.    Please read over the following fact sheets you were given: IF [place]  [phone]    If questions regarding bowel prep, please contact surgeon's office.    Preparing for Surgery  Before surgery, you can play an important role.  Because skin is not sterile, your skin needs to be as free of germs as possible.  You can reduce the number of germs on your skin by washing with CHG (chlorahexidine gluconate) soap before surgery.  CHG is an antiseptic cleaner which kills germs and bonds with the skin to continue killing germs even after washing. Please DO NOT use if you have an allergy to CHG or antibacterial soaps.  If your skin becomes reddened/irritated stop using the CHG and  inform your nurse when you arrive at Short Stay. Do not shave (including legs and underarms) for at least 48 hours prior to the first CHG shower.  You may shave your face/neck.  Please follow these instructions  carefully:  1.  Shower with CHG Soap the night before surgery and the  morning of surgery.  2.  If you choose to wash your hair, wash your hair first as usual with your normal  shampoo.  3.  After you shampoo, rinse your hair and body thoroughly to remove the shampoo.                             4.  Use CHG as you would any other liquid soap.  You can apply chg directly to the skin and wash.  Gently with a scrungie or clean washcloth.  5.  Apply the CHG Soap to your body ONLY FROM THE NECK DOWN.   Do   not use on face/ open                           Wound or open sores. Avoid contact with eyes, ears mouth and   genitals (private parts).                       Wash face,  Genitals (private parts) with your normal soap.             6.  Wash thoroughly, paying special attention to the area where your    surgery  will be performed.  7.  Thoroughly rinse your body with warm water from the neck down.  8.  DO NOT shower/wash with your normal soap after using and rinsing off the CHG Soap.                9.  Pat yourself dry with a clean towel.            10.  Wear clean pajamas.            11.  Place clean sheets on your bed the night of your first shower and do not  sleep with pets. Day of Surgery : Do not apply any lotions/deodorants the morning of surgery.  Please wear clean clothes to the hospital/surgery center.  FAILURE TO FOLLOW THESE INSTRUCTIONS MAY RESULT IN THE CANCELLATION OF YOUR SURGERY  PATIENT SIGNATURE_________________________________  NURSE SIGNATURE__________________________________  ________________________________________________________________________   Thomas Hunt  An incentive spirometer is a tool that can help keep your lungs clear and active. This tool measures how well you are filling your lungs with each breath. Taking long deep breaths may help reverse or decrease the chance of developing breathing (pulmonary) problems (especially infection) following:  A  long period of time when you are unable to move or be active. BEFORE THE PROCEDURE   If the spirometer includes an indicator to show your best effort, your nurse or respiratory therapist will set it to a desired goal.  If possible, sit up straight or lean slightly forward. Try not to slouch.  Hold the incentive spirometer in an upright position. INSTRUCTIONS FOR USE  1. Sit on the edge of your bed if possible, or sit up as far as you can in bed or on a chair. 2. Hold the incentive spirometer in an upright position. 3. Breathe out normally. 4. Place the mouthpiece in your mouth and seal  your lips tightly around it. 5. Breathe in slowly and as deeply as possible, raising the piston or the ball toward the top of the column. 6. Hold your breath for 3-5 seconds or for as long as possible. Allow the piston or ball to fall to the bottom of the column. 7. Remove the mouthpiece from your mouth and breathe out normally. 8. Rest for a few seconds and repeat Steps 1 through 7 at least 10 times every 1-2 hours when you are awake. Take your time and take a few normal breaths between deep breaths. 9. The spirometer may include an indicator to show your best effort. Use the indicator as a goal to work toward during each repetition. 10. After each set of 10 deep breaths, practice coughing to be sure your lungs are clear. If you have an incision (the cut made at the time of surgery), support your incision when coughing by placing a pillow or rolled up towels firmly against it. Once you are able to get out of bed, walk around indoors and cough well. You may stop using the incentive spirometer when instructed by your caregiver.  RISKS AND COMPLICATIONS  Take your time so you do not get dizzy or light-headed.  If you are in pain, you may need to take or ask for pain medication before doing incentive spirometry. It is harder to take a deep breath if you are having pain. AFTER USE  Rest and breathe slowly and  easily.  It can be helpful to keep track of a log of your progress. Your caregiver can provide you with a simple table to help with this. If you are using the spirometer at home, follow these instructions: [place] IF:   You are having difficultly using the spirometer.  You have trouble using the spirometer as often as instructed.  Your pain medication is not giving enough relief while using the spirometer.  You develop fever of 100.5 F (38.1 C) or higher. SEEK IMMEDIATE MEDICAL CARE IF:   You cough up bloody sputum that had not been present before.  You develop fever of 102 F (38.9 C) or greater.  You develop worsening pain at or near the incision site. MAKE SURE YOU:   Understand these instructions.  Will watch your condition.  Will get help right away if you are not doing well or get worse. Document Released: 04/01/2007 Document Revised: 02/11/2012 Document Reviewed: 06/02/2007 [employer] Patient Information 2014 [employer], [place].   ________________________________________________________________________  WHAT IS A BLOOD TRANSFUSION? Blood Transfusion Information  A transfusion is the replacement of blood or some of its parts. Blood is made up of multiple cells which provide different functions.  Red blood cells carry oxygen and are used for blood loss replacement.  White blood cells fight against infection.  Platelets control bleeding.  Plasma helps clot blood.  Other blood products are available for specialized needs, such as hemophilia or other clotting disorders. BEFORE THE TRANSFUSION  Who gives blood for transfusions?   Healthy volunteers who are fully evaluated to make sure their blood is safe. This is blood bank blood. Transfusion therapy is the safest it has ever been in the practice of medicine. Before blood is taken from a donor, a complete history is taken to make sure that person has no history of diseases nor engages in risky social  behavior (examples are intravenous drug use or sexual activity with multiple partners). The donor's travel history is screened to minimize risk of transmitting infections, such  as malaria. The donated blood is tested for signs of infectious diseases, such as HIV and hepatitis. The blood is then tested to be sure it is compatible with you in order to minimize the chance of a transfusion reaction. If you or a relative donates blood, this is often done in anticipation of surgery and is not appropriate for emergency situations. It takes many days to process the donated blood. RISKS AND COMPLICATIONS Although transfusion therapy is very safe and saves many lives, the main dangers of transfusion include:   Getting an infectious disease.  Developing a transfusion reaction. This is an allergic reaction to something in the blood you were given. Every precaution is taken to prevent this. The decision to have a blood transfusion has been considered carefully by your caregiver before blood is given. Blood is not given unless the benefits outweigh the risks. AFTER THE TRANSFUSION  Right after receiving a blood transfusion, you will usually feel much better and more energetic. This is especially true if your red blood cells have gotten low (anemic). The transfusion raises the level of the red blood cells which carry oxygen, and this usually causes an energy increase.  The nurse administering the transfusion will monitor you carefully for complications. HOME CARE INSTRUCTIONS  No special instructions are needed after a transfusion. You may find your energy is better. Speak with your caregiver about any limitations on activity for underlying diseases you may have. SEEK MEDICAL CARE IF:   Your condition is not improving after your transfusion.  You develop redness or irritation at the intravenous (IV) site. SEEK IMMEDIATE MEDICAL CARE IF:  Any of the following symptoms occur over the next 12 hours:  Shaking  chills.  You have a temperature by mouth above 102 F (38.9 C), not controlled by medicine.  Chest, back, or muscle pain.  People around you feel you are not acting correctly or are confused.  Shortness of breath or difficulty breathing.  Dizziness and fainting.  You get a rash or develop hives.  You have a decrease in urine output.  Your urine turns a dark color or changes to pink, red, or brown. Any of the following symptoms occur over the next 10 days:  You have a temperature by mouth above 102 F (38.9 C), not controlled by medicine.  Shortness of breath.  Weakness after normal activity.  The white part of the eye turns yellow (jaundice).  You have a decrease in the amount of urine or are urinating less often.  Your urine turns a dark color or changes to pink, red, or brown. Document Released: 11/16/2000 Document Revised: 02/11/2012 Document Reviewed: 07/05/2008 [facility] Patient Information 2014 [place], [place].  _______________________________________________________________________

## 2020-06-14 ENCOUNTER — Other Ambulatory Visit: Payer: Self-pay

## 2020-06-14 ENCOUNTER — Encounter (HOSPITAL_COMMUNITY): Payer: Self-pay

## 2020-06-14 ENCOUNTER — Encounter (HOSPITAL_COMMUNITY)
Admission: RE | Admit: 2020-06-14 | Discharge: 2020-06-14 | Disposition: A | Discharge status: Home / Self Care | Payer: BC Managed Care – PPO | Source: Ambulatory Visit | Attending: Surgery | Admitting: Surgery

## 2020-06-14 DIAGNOSIS — Z01812 Encounter for preprocedural laboratory examination: Secondary | ICD-10-CM | POA: Diagnosis present

## 2020-06-14 LAB — COMPREHENSIVE METABOLIC PANEL
ALT: 20 U/L (ref 0–44)
AST: 27 U/L (ref 15–41)
Albumin: 4.8 g/dL (ref 3.5–5.0)
Alkaline Phosphatase: 66 U/L (ref 38–126)
Anion gap: 7 (ref 5–15)
BUN: 14 mg/dL (ref 6–20)
CO2: 29 mmol/L (ref 22–32)
Calcium: 9.3 mg/dL (ref 8.9–10.3)
Chloride: 102 mmol/L (ref 98–111)
Creatinine, Ser: 0.71 mg/dL (ref 0.61–1.24)
GFR calc Af Amer: >60 mL/min (ref >60)
GFR calc non Af Amer: >60 mL/min (ref >60)
Glucose, Bld: 112 mg/dL — ABNORMAL HIGH (ref 70–99)
Potassium: 4 mmol/L (ref 3.5–5.1)
Sodium: 138 mmol/L (ref 135–145)
Total Bilirubin: 0.8 mg/dL (ref 0.3–1.2)
Total Protein: 7.8 g/dL (ref 6.5–8.1)

## 2020-06-14 LAB — CBC WITH DIFFERENTIAL/PLATELET
Abs Immature Granulocytes: 0.01 10*3/uL (ref 0.00–0.07)
Basophils Absolute: 0 10*3/uL (ref 0.0–0.1)
Basophils Relative: 0 %
Eosinophils Absolute: 0 10*3/uL (ref 0.0–0.5)
Eosinophils Relative: 0 %
HCT: 40.5 % (ref 39.0–52.0)
Hemoglobin: 13.5 g/dL (ref 13.0–17.0)
Immature Granulocytes: 0 %
Lymphocytes Relative: 23 %
Lymphs Abs: 1 10*3/uL (ref 0.7–4.0)
MCH: 28.4 pg (ref 26.0–34.0)
MCHC: 33.3 g/dL (ref 30.0–36.0)
MCV: 85.1 fL (ref 80.0–100.0)
Monocytes Absolute: 0.3 10*3/uL (ref 0.1–1.0)
Monocytes Relative: 7 %
Neutro Abs: 3.1 10*3/uL (ref 1.7–7.7)
Neutrophils Relative %: 70 %
Platelets: 281 10*3/uL (ref 150–400)
RBC: 4.76 MIL/uL (ref 4.22–5.81)
RDW: 12.5 % (ref 11.5–15.5)
WBC: 4.5 10*3/uL (ref 4.0–10.5)
nRBC: 0 % (ref 0.0–0.2)

## 2020-06-14 LAB — HEMOGLOBIN A1C
Hgb A1c MFr Bld: 4.9 % (ref 4.8–5.6)
Mean Plasma Glucose: 93.93 mg/dL

## 2020-06-14 LAB — PROTIME-INR
INR: 1 (ref 0.8–1.2)
Prothrombin Time: 12.7 seconds (ref 11.4–15.2)

## 2020-06-14 LAB — APTT: aPTT: 32 seconds (ref 24–36)

## 2020-06-21 ENCOUNTER — Other Ambulatory Visit (HOSPITAL_COMMUNITY)
Admission: RE | Admit: 2020-06-21 | Discharge: 2020-06-21 | Disposition: A | Discharge status: Home / Self Care | Payer: BC Managed Care – PPO | Source: Ambulatory Visit | Attending: Surgery | Admitting: Surgery

## 2020-06-21 DIAGNOSIS — Z20822 Contact with and (suspected) exposure to covid-19: Secondary | ICD-10-CM | POA: Insufficient documentation

## 2020-06-21 DIAGNOSIS — Z01812 Encounter for preprocedural laboratory examination: Secondary | ICD-10-CM | POA: Insufficient documentation

## 2020-06-21 LAB — SARS CORONAVIRUS 2 (TAT 6-24 HRS): SARS Coronavirus 2: NEGATIVE

## 2020-06-23 MED ORDER — BUPIVACAINE LIPOSOME 1.3 % IJ SUSP
20.0000 mL | Freq: Once | INTRAMUSCULAR | Status: DC
  Filled 2020-06-23: qty 20

## 2020-06-24 ENCOUNTER — Inpatient Hospital Stay (HOSPITAL_COMMUNITY): Payer: BC Managed Care – PPO | Admitting: Anesthesiology

## 2020-06-24 ENCOUNTER — Encounter (HOSPITAL_COMMUNITY)
Admission: RE | Disposition: A | Discharge status: Home / Self Care | Payer: Self-pay | Source: Home / Self Care | Attending: Surgery

## 2020-06-24 ENCOUNTER — Encounter (HOSPITAL_COMMUNITY): Payer: Self-pay | Admitting: Surgery

## 2020-06-24 ENCOUNTER — Other Ambulatory Visit: Payer: Self-pay

## 2020-06-24 ENCOUNTER — Inpatient Hospital Stay (HOSPITAL_COMMUNITY)
Admission: RE | Admit: 2020-06-24 | Discharge: 2020-06-26 | DRG: 331 | Disposition: A | Discharge status: Home / Self Care | Payer: BC Managed Care – PPO | Attending: Surgery | Admitting: Surgery

## 2020-06-24 DIAGNOSIS — Z85038 Personal history of other malignant neoplasm of large intestine: Secondary | ICD-10-CM | POA: Diagnosis not present

## 2020-06-24 DIAGNOSIS — Z800 Family history of malignant neoplasm of digestive organs: Secondary | ICD-10-CM

## 2020-06-24 DIAGNOSIS — D125 Benign neoplasm of sigmoid colon: Secondary | ICD-10-CM | POA: Diagnosis present

## 2020-06-24 DIAGNOSIS — Z8249 Family history of ischemic heart disease and other diseases of the circulatory system: Secondary | ICD-10-CM | POA: Diagnosis not present

## 2020-06-24 DIAGNOSIS — Z801 Family history of malignant neoplasm of trachea, bronchus and lung: Secondary | ICD-10-CM

## 2020-06-24 DIAGNOSIS — Z8042 Family history of malignant neoplasm of prostate: Secondary | ICD-10-CM | POA: Diagnosis not present

## 2020-06-24 DIAGNOSIS — Z8371 Family history of colonic polyps: Secondary | ICD-10-CM

## 2020-06-24 DIAGNOSIS — Z20822 Contact with and (suspected) exposure to covid-19: Secondary | ICD-10-CM | POA: Diagnosis present

## 2020-06-24 DIAGNOSIS — E785 Hyperlipidemia, unspecified: Secondary | ICD-10-CM | POA: Diagnosis present

## 2020-06-24 DIAGNOSIS — C189 Malignant neoplasm of colon, unspecified: Principal | ICD-10-CM | POA: Diagnosis present

## 2020-06-24 DIAGNOSIS — Z9049 Acquired absence of other specified parts of digestive tract: Secondary | ICD-10-CM

## 2020-06-24 LAB — TYPE AND SCREEN
ABO/RH(D): A POS
Antibody Screen: NEGATIVE

## 2020-06-24 LAB — ABO/RH: ABO/RH(D): A POS

## 2020-06-24 SURGERY — COLECTOMY, PARTIAL, ROBOT-ASSISTED, LAPAROSCOPIC
Anesthesia: General | Site: Abdomen

## 2020-06-24 MED ORDER — SUGAMMADEX SODIUM 200 MG/2ML IV SOLN
INTRAVENOUS | Status: DC | PRN
  Administered 2020-06-24: 200 mg via INTRAVENOUS

## 2020-06-24 MED ORDER — FENTANYL CITRATE (PF) 100 MCG/2ML IJ SOLN
INTRAMUSCULAR | Status: DC | PRN
  Administered 2020-06-24: 150 ug via INTRAVENOUS
  Administered 2020-06-24 (×2): 50 ug via INTRAVENOUS

## 2020-06-24 MED ORDER — ACETAMINOPHEN 500 MG PO TABS
1000.0000 mg | ORAL_TABLET | Freq: Four times a day (QID) | ORAL | Status: DC
  Administered 2020-06-24 – 2020-06-26 (×7): 1000 mg via ORAL
  Filled 2020-06-24 (×7): qty 2

## 2020-06-24 MED ORDER — BUPIVACAINE-EPINEPHRINE (PF) 0.25% -1:200000 IJ SOLN
INTRAMUSCULAR | Status: DC | PRN
  Administered 2020-06-24: 30 mL

## 2020-06-24 MED ORDER — ONDANSETRON HCL 4 MG/2ML IJ SOLN: 4.0000 mg | Freq: Four times a day (QID) | INTRAMUSCULAR | Status: DC | PRN

## 2020-06-24 MED ORDER — BISACODYL 5 MG PO TBEC: 20.0000 mg | DELAYED_RELEASE_TABLET | Freq: Once | ORAL | Status: DC

## 2020-06-24 MED ORDER — OXYCODONE HCL 5 MG/5ML PO SOLN
5.0000 mg | Freq: Once | ORAL | Status: AC | PRN
  Administered 2020-06-24: 5 mg via ORAL

## 2020-06-24 MED ORDER — NEOMYCIN SULFATE 500 MG PO TABS
1000.0000 mg | ORAL_TABLET | ORAL | Status: DC
Start: 2020-06-24 — End: 2020-06-24

## 2020-06-24 MED ORDER — ALUM & MAG HYDROXIDE-SIMETH 200-200-20 MG/5ML PO SUSP: 30.0000 mL | Freq: Four times a day (QID) | ORAL | Status: DC | PRN

## 2020-06-24 MED ORDER — ORAL CARE MOUTH RINSE: 15.0000 mL | Freq: Once | OROMUCOSAL | Status: AC

## 2020-06-24 MED ORDER — METRONIDAZOLE 500 MG PO TABS
1000.0000 mg | ORAL_TABLET | ORAL | Status: DC
Start: 2020-06-24 — End: 2020-06-24

## 2020-06-24 MED ORDER — DIPHENHYDRAMINE HCL 50 MG/ML IJ SOLN: 12.5000 mg | Freq: Four times a day (QID) | INTRAMUSCULAR | Status: DC | PRN

## 2020-06-24 MED ORDER — CHLORHEXIDINE GLUCONATE CLOTH 2 % EX PADS: 6.0000 | MEDICATED_PAD | Freq: Once | CUTANEOUS | Status: DC

## 2020-06-24 MED ORDER — DEXAMETHASONE SODIUM PHOSPHATE 10 MG/ML IJ SOLN
INTRAMUSCULAR | Status: DC | PRN
  Administered 2020-06-24: 10 mg via INTRAVENOUS

## 2020-06-24 MED ORDER — SIMETHICONE 80 MG PO CHEW: 40.0000 mg | CHEWABLE_TABLET | Freq: Four times a day (QID) | ORAL | Status: DC | PRN

## 2020-06-24 MED ORDER — ALVIMOPAN 12 MG PO CAPS
12.0000 mg | ORAL_CAPSULE | ORAL | Status: AC
  Administered 2020-06-24: 12 mg via ORAL
  Filled 2020-06-24: qty 1

## 2020-06-24 MED ORDER — PROPOFOL 10 MG/ML IV BOLUS
INTRAVENOUS | Status: DC | PRN
  Administered 2020-06-24: 200 mg via INTRAVENOUS

## 2020-06-24 MED ORDER — FENTANYL CITRATE (PF) 100 MCG/2ML IJ SOLN: 25.0000 ug | INTRAMUSCULAR | Status: DC | PRN

## 2020-06-24 MED ORDER — OXYCODONE HCL 5 MG PO TABS: 5.0000 mg | ORAL_TABLET | Freq: Once | ORAL | Status: AC | PRN

## 2020-06-24 MED ORDER — 0.9 % SODIUM CHLORIDE (POUR BTL) OPTIME
TOPICAL | Status: DC | PRN
  Administered 2020-06-24: 2000 mL

## 2020-06-24 MED ORDER — SODIUM CHLORIDE 0.9 % IV SOLN
2.0000 g | INTRAVENOUS | Status: AC
  Administered 2020-06-24: 2 g via INTRAVENOUS
  Filled 2020-06-24: qty 2

## 2020-06-24 MED ORDER — KETAMINE HCL 10 MG/ML IJ SOLN
INTRAMUSCULAR | Status: DC | PRN
Start: 2020-06-24 — End: 2020-06-24
  Administered 2020-06-24 (×2): 20 mg via INTRAVENOUS

## 2020-06-24 MED ORDER — FENTANYL CITRATE (PF) 250 MCG/5ML IJ SOLN
INTRAMUSCULAR | Status: AC
  Filled 2020-06-24: qty 5

## 2020-06-24 MED ORDER — ONDANSETRON HCL 4 MG PO TABS: 4.0000 mg | ORAL_TABLET | Freq: Four times a day (QID) | ORAL | Status: DC | PRN

## 2020-06-24 MED ORDER — HEPARIN SODIUM (PORCINE) 5000 UNIT/ML IJ SOLN
5000.0000 [IU] | Freq: Once | INTRAMUSCULAR | Status: AC
  Administered 2020-06-24: 5000 [IU] via SUBCUTANEOUS
  Filled 2020-06-24: qty 1

## 2020-06-24 MED ORDER — CHLORHEXIDINE GLUCONATE 0.12 % MT SOLN
15.0000 mL | Freq: Once | OROMUCOSAL | Status: AC
  Administered 2020-06-24: 15 mL via OROMUCOSAL

## 2020-06-24 MED ORDER — LIDOCAINE 2% (20 MG/ML) 5 ML SYRINGE
INTRAMUSCULAR | Status: AC
  Filled 2020-06-24: qty 10

## 2020-06-24 MED ORDER — LIDOCAINE 2% (20 MG/ML) 5 ML SYRINGE
INTRAMUSCULAR | Status: DC | PRN
  Administered 2020-06-24: 100 mg via INTRAVENOUS
  Administered 2020-06-24: 1.5 mg/kg/h via INTRAVENOUS

## 2020-06-24 MED ORDER — ALVIMOPAN 12 MG PO CAPS: 12.0000 mg | ORAL_CAPSULE | Freq: Two times a day (BID) | ORAL | Status: DC

## 2020-06-24 MED ORDER — LIDOCAINE HCL 2 % IJ SOLN
INTRAMUSCULAR | Status: AC
  Filled 2020-06-24: qty 40

## 2020-06-24 MED ORDER — ONDANSETRON HCL 4 MG/2ML IJ SOLN
INTRAMUSCULAR | Status: DC | PRN
  Administered 2020-06-24 (×2): 4 mg via INTRAVENOUS

## 2020-06-24 MED ORDER — LACTATED RINGERS IV SOLN: INTRAVENOUS | Status: DC

## 2020-06-24 MED ORDER — DEXAMETHASONE SODIUM PHOSPHATE 10 MG/ML IJ SOLN
INTRAMUSCULAR | Status: AC
  Filled 2020-06-24: qty 2

## 2020-06-24 MED ORDER — ONDANSETRON HCL 4 MG/2ML IJ SOLN
INTRAMUSCULAR | Status: AC
  Filled 2020-06-24: qty 4

## 2020-06-24 MED ORDER — OXYCODONE HCL 5 MG/5ML PO SOLN
ORAL | Status: AC
  Filled 2020-06-24: qty 5

## 2020-06-24 MED ORDER — POLYETHYLENE GLYCOL 3350 17 GM/SCOOP PO POWD: 1.0000 | Freq: Once | ORAL | Status: DC

## 2020-06-24 MED ORDER — PROPOFOL 10 MG/ML IV BOLUS
INTRAVENOUS | Status: AC
  Filled 2020-06-24: qty 40

## 2020-06-24 MED ORDER — MIDAZOLAM HCL 5 MG/5ML IJ SOLN
INTRAMUSCULAR | Status: DC | PRN
  Administered 2020-06-24: 2 mg via INTRAVENOUS

## 2020-06-24 MED ORDER — MIDAZOLAM HCL 2 MG/2ML IJ SOLN
INTRAMUSCULAR | Status: AC
  Filled 2020-06-24: qty 2

## 2020-06-24 MED ORDER — PHENYLEPHRINE 40 MCG/ML (10ML) SYRINGE FOR IV PUSH (FOR BLOOD PRESSURE SUPPORT)
PREFILLED_SYRINGE | INTRAVENOUS | Status: DC | PRN
  Administered 2020-06-24: 80 ug via INTRAVENOUS

## 2020-06-24 MED ORDER — ENSURE SURGERY PO LIQD
237.0000 mL | Freq: Two times a day (BID) | ORAL | Status: DC
  Administered 2020-06-25 (×2): 237 mL via ORAL

## 2020-06-24 MED ORDER — ACETAMINOPHEN 500 MG PO TABS
1000.0000 mg | ORAL_TABLET | ORAL | Status: AC
  Administered 2020-06-24: 1000 mg via ORAL
  Filled 2020-06-24: qty 2

## 2020-06-24 MED ORDER — HEPARIN SODIUM (PORCINE) 5000 UNIT/ML IJ SOLN
5000.0000 [IU] | Freq: Three times a day (TID) | INTRAMUSCULAR | Status: DC
  Administered 2020-06-24 – 2020-06-26 (×5): 5000 [IU] via SUBCUTANEOUS
  Filled 2020-06-24 (×5): qty 1

## 2020-06-24 MED ORDER — BUPIVACAINE-EPINEPHRINE (PF) 0.25% -1:200000 IJ SOLN
INTRAMUSCULAR | Status: AC
  Filled 2020-06-24: qty 30

## 2020-06-24 MED ORDER — KETAMINE HCL 10 MG/ML IJ SOLN
INTRAMUSCULAR | Status: AC
  Filled 2020-06-24: qty 1

## 2020-06-24 MED ORDER — INDOCYANINE GREEN 25 MG IV SOLR
INTRAVENOUS | Status: DC | PRN
Start: 2020-06-24 — End: 2020-06-24
  Administered 2020-06-24: 7.5 mg via INTRAVENOUS

## 2020-06-24 MED ORDER — IBUPROFEN 400 MG PO TABS
600.0000 mg | ORAL_TABLET | Freq: Four times a day (QID) | ORAL | Status: DC | PRN
  Administered 2020-06-25 (×3): 600 mg via ORAL
  Filled 2020-06-24 (×4): qty 1

## 2020-06-24 MED ORDER — DIPHENHYDRAMINE HCL 12.5 MG/5ML PO ELIX: 12.5000 mg | ORAL_SOLUTION | Freq: Four times a day (QID) | ORAL | Status: DC | PRN

## 2020-06-24 MED ORDER — PHENYLEPHRINE 40 MCG/ML (10ML) SYRINGE FOR IV PUSH (FOR BLOOD PRESSURE SUPPORT)
PREFILLED_SYRINGE | INTRAVENOUS | Status: AC
  Filled 2020-06-24: qty 10

## 2020-06-24 MED ORDER — TRAMADOL HCL 50 MG PO TABS
50.0000 mg | ORAL_TABLET | Freq: Four times a day (QID) | ORAL | Status: DC | PRN
  Administered 2020-06-26 (×2): 50 mg via ORAL
  Filled 2020-06-24 (×2): qty 1

## 2020-06-24 MED ORDER — BUPIVACAINE LIPOSOME 1.3 % IJ SUSP
INTRAMUSCULAR | Status: DC | PRN
  Administered 2020-06-24: 20 mL

## 2020-06-24 MED ORDER — HYDROMORPHONE HCL 1 MG/ML IJ SOLN: 0.5000 mg | INTRAMUSCULAR | Status: DC | PRN

## 2020-06-24 MED ORDER — ROCURONIUM BROMIDE 10 MG/ML (PF) SYRINGE
PREFILLED_SYRINGE | INTRAVENOUS | Status: DC | PRN
  Administered 2020-06-24: 20 mg via INTRAVENOUS
  Administered 2020-06-24: 60 mg via INTRAVENOUS
  Administered 2020-06-24: 20 mg via INTRAVENOUS

## 2020-06-24 MED ORDER — ROCURONIUM BROMIDE 10 MG/ML (PF) SYRINGE
PREFILLED_SYRINGE | INTRAVENOUS | Status: AC
  Filled 2020-06-24: qty 20

## 2020-06-24 MED ORDER — LACTATED RINGERS IR SOLN
Status: DC | PRN
  Administered 2020-06-24: 1000 mL

## 2020-06-24 SURGICAL SUPPLY — 99 items
APPLIER CLIP 5 13 M/L LIGAMAX5 (MISCELLANEOUS)
APPLIER CLIP ROT 10 11.4 M/L (STAPLE)
BLADE EXTENDED COATED 6.5IN (ELECTRODE) ×3 IMPLANT
CELLS DAT CNTRL 66122 CELL SVR (MISCELLANEOUS) IMPLANT
CHLORAPREP W/TINT 26 (MISCELLANEOUS) IMPLANT
CLIP APPLIE 5 13 M/L LIGAMAX5 (MISCELLANEOUS) IMPLANT
CLIP APPLIE ROT 10 11.4 M/L (STAPLE) IMPLANT
CLIP VESOLOCK LG 6/CT PURPLE (CLIP) IMPLANT
CLIP VESOLOCK MED LG 6/CT (CLIP) IMPLANT
COVER SURGICAL LIGHT HANDLE (MISCELLANEOUS) ×6 IMPLANT
COVER TIP SHEARS 8 DVNC (MISCELLANEOUS) ×1 IMPLANT
COVER TIP SHEARS 8MM DA VINCI (MISCELLANEOUS) ×3
COVER WAND RF STERILE (DRAPES) IMPLANT
DECANTER SPIKE VIAL GLASS SM (MISCELLANEOUS) IMPLANT
DERMABOND ADVANCED (GAUZE/BANDAGES/DRESSINGS) ×2
DERMABOND ADVANCED .7 DNX12 (GAUZE/BANDAGES/DRESSINGS) ×1 IMPLANT
DEVICE TROCAR PUNCTURE CLOSURE (ENDOMECHANICALS) IMPLANT
DRAIN CHANNEL 19F RND (DRAIN) IMPLANT
DRAPE ARM DVNC X/XI (DISPOSABLE) ×4 IMPLANT
DRAPE COLUMN DVNC XI (DISPOSABLE) ×1 IMPLANT
DRAPE DA VINCI XI ARM (DISPOSABLE) ×12
DRAPE DA VINCI XI COLUMN (DISPOSABLE) ×3
DRAPE SURG IRRIG POUCH 19X23 (DRAPES) ×3 IMPLANT
DRSG OPSITE POSTOP 4X10 (GAUZE/BANDAGES/DRESSINGS) IMPLANT
DRSG OPSITE POSTOP 4X6 (GAUZE/BANDAGES/DRESSINGS) ×3 IMPLANT
DRSG OPSITE POSTOP 4X8 (GAUZE/BANDAGES/DRESSINGS) IMPLANT
DRSG TEGADERM 2-3/8X2-3/4 SM (GAUZE/BANDAGES/DRESSINGS) ×15 IMPLANT
DRSG TEGADERM 4X4.75 (GAUZE/BANDAGES/DRESSINGS) ×3 IMPLANT
ELECT REM PT RETURN 15FT ADLT (MISCELLANEOUS) ×3 IMPLANT
ENDOLOOP SUT PDS II  0 18 (SUTURE)
ENDOLOOP SUT PDS II 0 18 (SUTURE) IMPLANT
EVACUATOR SILICONE 100CC (DRAIN) IMPLANT
GAUZE SPONGE 2X2 8PLY STRL LF (GAUZE/BANDAGES/DRESSINGS) ×1 IMPLANT
GAUZE SPONGE 4X4 12PLY STRL (GAUZE/BANDAGES/DRESSINGS) IMPLANT
GLOVE BIO SURGEON STRL SZ7.5 (GLOVE) ×9 IMPLANT
GLOVE INDICATOR 8.0 STRL GRN (GLOVE) ×9 IMPLANT
GOWN STRL REUS W/TWL XL LVL3 (GOWN DISPOSABLE) ×15 IMPLANT
GRASPER SUT TROCAR 14GX15 (MISCELLANEOUS) IMPLANT
HOLDER FOLEY CATH W/STRAP (MISCELLANEOUS) ×3 IMPLANT
KIT PROCEDURE DA VINCI SI (MISCELLANEOUS) ×3
KIT PROCEDURE DVNC SI (MISCELLANEOUS) ×1 IMPLANT
KIT TURNOVER KIT A (KITS) IMPLANT
NEEDLE INSUFFLATION 14GA 120MM (NEEDLE) ×3 IMPLANT
PACK CARDIOVASCULAR III (CUSTOM PROCEDURE TRAY) ×3 IMPLANT
PACK COLON (CUSTOM PROCEDURE TRAY) ×3 IMPLANT
PAD POSITIONING PINK XL (MISCELLANEOUS) ×3 IMPLANT
PENCIL SMOKE EVACUATOR (MISCELLANEOUS) IMPLANT
PORT LAP GEL ALEXIS MED 5-9CM (MISCELLANEOUS) ×3 IMPLANT
PROTECTOR NERVE ULNAR (MISCELLANEOUS) ×3 IMPLANT
RELOAD STAPLER 4.3X60 GRN DVNC (STAPLE) ×2 IMPLANT
RTRCTR WOUND ALEXIS 18CM MED (MISCELLANEOUS)
SCISSORS LAP 5X35 DISP (ENDOMECHANICALS) IMPLANT
SEAL CANN UNIV 5-8 DVNC XI (MISCELLANEOUS) ×4 IMPLANT
SEAL XI 5MM-8MM UNIVERSAL (MISCELLANEOUS) ×12
SEALER VESSEL DA VINCI XI (MISCELLANEOUS) ×3
SEALER VESSEL EXT DVNC XI (MISCELLANEOUS) ×1 IMPLANT
SET IRRIG TUBING LAPAROSCOPIC (IRRIGATION / IRRIGATOR) ×3 IMPLANT
SLEEVE ADV FIXATION 5X100MM (TROCAR) IMPLANT
SOLUTION ELECTROLUBE (MISCELLANEOUS) ×3 IMPLANT
SPONGE GAUZE 2X2 STER 10/PKG (GAUZE/BANDAGES/DRESSINGS) ×2
STAPLER 60 DA VINCI SURE FORM (STAPLE) ×3
STAPLER 60 SUREFORM DVNC (STAPLE) ×1 IMPLANT
STAPLER CANNULA SEAL DVNC XI (STAPLE) ×1 IMPLANT
STAPLER CANNULA SEAL XI (STAPLE) ×3
STAPLER ECHELON POWER CIR 29 (STAPLE) ×3 IMPLANT
STAPLER RELOAD 4.3X60 GREEN (STAPLE) ×6
STAPLER RELOAD 4.3X60 GRN DVNC (STAPLE) ×2
STAPLER SHEATH (SHEATH)
STAPLER SHEATH ENDOWRIST DVNC (SHEATH) IMPLANT
STOPCOCK 4 WAY LG BORE MALE ST (IV SETS) ×6 IMPLANT
SURGILUBE 2OZ TUBE FLIPTOP (MISCELLANEOUS) ×3 IMPLANT
SUT MNCRL AB 4-0 PS2 18 (SUTURE) ×6 IMPLANT
SUT PDS AB 1 CT1 27 (SUTURE) ×6 IMPLANT
SUT PDS AB 1 TP1 96 (SUTURE) IMPLANT
SUT PROLENE 0 CT 2 (SUTURE) IMPLANT
SUT PROLENE 2 0 KS (SUTURE) ×3 IMPLANT
SUT PROLENE 2 0 SH DA (SUTURE) IMPLANT
SUT SILK 2 0 (SUTURE)
SUT SILK 2 0 SH CR/8 (SUTURE) IMPLANT
SUT SILK 2-0 18XBRD TIE 12 (SUTURE) IMPLANT
SUT SILK 3 0 (SUTURE) ×3
SUT SILK 3 0 SH CR/8 (SUTURE) ×3 IMPLANT
SUT SILK 3-0 18XBRD TIE 12 (SUTURE) ×1 IMPLANT
SUT V-LOC BARB 180 2/0GR6 GS22 (SUTURE)
SUT VIC AB 3-0 SH 18 (SUTURE) IMPLANT
SUT VIC AB 3-0 SH 27 (SUTURE)
SUT VIC AB 3-0 SH 27XBRD (SUTURE) IMPLANT
SUT VICRYL 0 UR6 27IN ABS (SUTURE) ×3 IMPLANT
SUTURE V-LC BRB 180 2/0GR6GS22 (SUTURE) IMPLANT
SYR 10ML LL (SYRINGE) ×3 IMPLANT
SYS LAPSCP GELPORT 120MM (MISCELLANEOUS)
SYSTEM LAPSCP GELPORT 120MM (MISCELLANEOUS) IMPLANT
TAPE UMBILICAL COTTON 1/8X30 (MISCELLANEOUS) IMPLANT
TOWEL OR NON WOVEN STRL DISP B (DISPOSABLE) ×3 IMPLANT
TRAY FOLEY MTR SLVR 16FR STAT (SET/KITS/TRAYS/PACK) ×3 IMPLANT
TROCAR ADV FIXATION 5X100MM (TROCAR) ×3 IMPLANT
TUBING CONNECTING 10 (TUBING) ×4 IMPLANT
TUBING CONNECTING 10' (TUBING) ×2
TUBING INSUFFLATION 10FT LAP (TUBING) ×3 IMPLANT

## 2020-06-24 NOTE — Op Note (Signed)
PATIENT: Thomas Hunt  39 y.o. male  Patient Care Team: Shon Baton, MD as PCP - General (Internal Medicine)  PREOP DIAGNOSIS: COLON CANCER  POSTOP DIAGNOSIS: COLON CANCER  PROCEDURE:  1. Robotic segmental colectomy 2. Flexible sigmoidoscopy 3. Bilateral transversus abdominus plane blocks  SURGEON: Sharon Mt. Dema Severin, MD  ASSISTANT: Leighton Ruff, MD  ANESTHESIA: General endotracheal  EBL: 50 mL Total I/O In: 1300 [I.V.:1300] Out: 250 [Urine:200; Blood:50]  DRAINS: None  SPECIMEN:  1. Rectosigmoid colon 2. Additional mesenteric lymph node 3. Proximal donut (anastomosis) 4. Distal donut (anastomosis)  COUNTS: Sponge, needle and instrument counts were reported correct x2  FINDINGS:  No obvious metastatic disease on visceral parietal peritoneum or liver.  Tattoo identified in proximal sigmoid at junction with descending colon. Well perfused, tension free, air tight, hemostatic 29 mm EEA colorectal anastomosis fashioned 18 cm from the anal verge by flexible sigmoidoscopy.  STATEMENT OF MEDICAL NECESSITY: Thomas Hunt is a very pleasant 39yoM who has a couple year history of vague abdominal/pelvic complaints. He reports ongoing issues with some degree of rectal fullness and incomplete defecation as well as a sense of a muscle twitching in his pelvic floor. He had been seen in gastroenterology, Dr. Tarri Glenn and ultimately had a colonoscopy done. He denied any history of rectal bleeding. Colonoscopy is completed 03/15/20 and demonstrated a 13 mm polyp in the sigmoid colon that was semi-pedunculated and removed with a hot snare. Resection was complete. A 2 mm polyp in the distal transverse colon removed and additionally, 10 discrete focal areas of erythema and mildly altered vascularity were seen and biopsied. 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 Transverse colon polyp  return as a tubular adenoma. Biopsies showed mucosa with focus of ectatic vascular channels, mostly capillaries. Carcinoma measured 4 mm. There is no lymphovascular invasion noted. Carcinoma is present approximately 0.5 mm from the inked margin. IHC/mismatch repair protein normal. He had a subsequent CT abdomen and pelvis which demonstrated no evidence of metastatic disease. CT chest 04/22/20 demonstrated no evidence of metastatic disease either. We met and discussed everything at length and options moving forward. He opted to proceed with surgery - please refer to notes elsewhere for details regarding these discussions.  NARRATIVE: Informed consent was verified. He was taken to the operating room, placed supine on the operating table and SCD's were applied. General endotracheal anesthesia was induced without difficulty. The patient was then positioned in the lithotomy position with Allen stirrups.  A foley catheter was then placed by nursing under sterile conditions. Hair on the abdomen was clipped.  The abdomen was then prepped and draped in the standard sterile fashion. Surgical timeout was called indicating the correct patient, procedure, positioning and need for preoperative antibiotics.   An OG tube was placed by anesthesia and confirmed to be to suction.  At Palmer's point, a stab incision was created and the Veress needle was introduced into the peritoneal cavity on the first attempt.  Intraperitoneal location was confirmed by the aspiration and saline drop test.  Pneumoperitoneum was established to a maximum pressure of 15 mmHg using CO2.  Following this, the abdomen was marked for planned trocar sites.  Just to the right and cephalad to the umbilicus, an 8 mm incision was created and an 8 mm blunt tipped robotic trocar was cautiously placed into the peritoneal cavity.  The laparoscope was inserted and demonstrated no evidence of trocar site nor Veress needle site complications.  The Veress needle  was removed.  Bilateral transversus abdominis plane blocks were then created using a dilute mixture of Exparel with Marcaine.  3 additional 8 mm robotic trochars were placed under direct visualization roughly in a line extending from the right ASIS towards the left upper quadrant. The bladder was inspected and noted to be at/below the pubic symphysis.  Staying 3 fingerbreadths above the pubic symphysis, an incision was created and the 12 mm robotic trocar inserted directed cephalad into the peritoneal cavity under direct visualization.  An additional 5 mm assist port was placed in the right lateral abdomen under direct visualization.  The abdomen was surveyed and there was tattoo identified at the junction of the proximal sigmoid colon with the descending colon. No other abnormalities were seen on inspection of the abdominal cavity.  He was positioned in Trendelenburg with some left side up.  Small bowel was carefully retracted out of the pelvis.  The robot was then docked and I went to the console.   The sigmoid colon was readily identified.  Attachments of the sigmoid colon were taken down from the intersigmoid fossa.  The rectosigmoid colon was grasped and elevated anteriorly.  Beginning with a medial to lateral approach, the peritoneum overlying the presacral space was carefully incised.  The TME plane was readily gained working in a plane between the fascia propria of the rectum and the presacral fascia.  Hypogastric nerves were seen going along the the presacral fascia and were protected free of injury.  Working more proximally, the mesorectum and sigmoid mesentery were carefully mobilized off of the peritoneum.  The left ureter was identified and protected free of injury.  The left gonadal vessels were identified and protected.  These were both swept "down."  The superior hemorrhoidal and IMA pedicles were identified. Further mesocolon was mobilized proximally staying in this plane between the  retroperitoneum proper and the mesocolon. Attention was then turned to the lateral portion of dissection.  The sigmoid colon was retracted to the right.  The sigmoid colon was fully mobilized and working proximal to the diseased segment of colon, the descending colon was mobilized by incising the Dayelin Balducci line of Toldt.  This was done all the way up to the level of the splenic flexure and including portions of it as well.  The associated mesocolon was also mobilized medially.  The left ureter again was confirmed to be well away for plan dissection and well away from the vasculature which had been dissected medially.  The rectosigmoid colon was elevated anteriorly. The left ureter was re-identified. The IMA was clear of this. The IMA was then divided with the vessel sealer. The stump was inspected and noted to be completely hemostatic with a good seal.  The mesentery was divided out to the point of planned proximal division.  Working more distally, the rectum was identified where the tinea had splayed and there were loss of appendices epiploica.  This also corresponded to a location overlying the sacral promontory.  Anatomically, this clearly represents the proximal rectum.  The mesentery out to this level was then cleared using the vessel sealer. The distal point of transection on the proximal rectum was identified.   A 65 mm green load robotic stapler was then placed through the 12 mm port and introduced into the peritoneal cavity.  The rectum was divided.  The stump was intact and healthy in appearance.   Attention was turned to performing a perfusion test. ICG was administered by anesthesia and at  the level of the cleared mesentery proximally, there was excellent uptake of the tracer.  The rectum was also well perfused in appearance.  There was a visible pulse in the mesentery out to the level of the cleared colon at the level of the proximal sigmoid/descending colon junction.  This colon is also supple and  healthy in appearance without any thickening.  This reached into the pelvis without any difficulty and remained in that location without any tension. A locking grasper was then placed on the sigmoid staple line.   Attention was turned to the extracorporeal portion of the procedure.  The robot was undocked.  I scrubbed back in.  Using the 12 mm trocar site, a Pfannenstiel incision was created and incorporated the fascial opening through the 12 mm port site.  The rectus fascia was incised and then elevated.  The rectus muscle was mobilized free of the overlying fascia.  The peritoneum was incised in the midline well above the location of the bladder.  An [place] wound protector was placed.  Towels were placed around the field.  The divided colon was passed through the wound protector.  The point of proximal division was identified and was again on a healthy segment of supple colon with a palpable pulse in the mesentery. Grossly, this was 5 cm proximal to the tattoo that marked to location of his polypectomy site. This was pink in color.  A pursestring device was applied.  A 2-0 Prolene on a Keith needle was passed.  The colon was divided and passed off with the open end being proximal.  EEA sizers were then introduced and a 29 mm EEA selected.  "Belt loops" consisting of 3-0 silk were placed around the pursestring suture line.  The anvil was placed and the pursestring tied.  A small amount of fat was cleared from the planned anastomosis and no diverticula were apparent within this. There was an additional mesenteric node identified at this location that was removed and submitted separately as specimen. The marginal artery had a palpable pulse at this location. The colon was placed back into the abdomen and a cap placed over the wound protector port site.  Pneumoperitoneum was reestablished.  I then went below to pass the stapler.  My partner remained above.  EEA sizers were cautiously passed trans-anally under direct  visualization.  The stapler was passed and the spike deployed just anterior to the staple line.  The components were then mated.  Orientation was confirmed such that there is no twisting of the colon nor small bowel underneath the mesenteric defect. Care was taken to ensure no other structures were incorporated within this either.  The stapler was then closed, held, and fired. This was then removed. The donuts were inspected and noted to be complete.  The colon proximal to the anastomosis was then gently occluded. The pelvis was filled with sterile irrigation. Under direct visualization, I passed a flexible sigmoidoscope.  The anastomosis was under water.  With good distention of the anastomosis there was no air leak. The anastomosis pink in appearance.  This is located at 18 cm from the anal verge by flexible sigmoidoscopy.  It is hemostatic.  Additionally, looking from above, there is no tension on the colon or mesentery.  Sigmoidoscope was withdrawn.  Irrigation was evacuated from the pelvis.  The abdomen and pelvis are surveyed and noted to be completely hemostatic without any apparent injury.  Under direct visualization, all trochars are removed.  The [place] wound protector was  removed.  Gowns/gloves are changed and a fresh set of clean instruments utilized. Additional sterile drapes were placed around the field.   Sponge, needle, and instrument counts were reported correct.  The Pfannenstiel peritoneum was closed with a running 2-0 Vicryl suture.  The rectus fascia was then closed using 2 running #1 PDS sutures.  The fascia was then palpated and noted to be completely closed.  Additional anesthetic was infiltrated at the Pfannenstiel site.  Sponge, needle, and instrument counts were then again reported correct. 4-0 Monocryl subcuticular suture was used to close the skin of all incision sites.  Dermabond was placed over all incisions.  A honeycomb dressing placed over the Pfannenstiel as well.   He was then  taken out of lithotomy, awakened from anesthesia, extubated, and transferred to a stretcher for transport to PACU in satisfactory condition having tolerated the procedure well.

## 2020-06-24 NOTE — Anesthesia Preprocedure Evaluation (Signed)
Anesthesia Evaluation  Patient identified by MRN, date of birth, ID band Patient awake    Reviewed: Allergy & Precautions, H&P , NPO status , Patient's Chart, lab work & pertinent test results  Airway Mallampati: II   Neck ROM: full    Dental   Pulmonary neg pulmonary ROS,    breath sounds clear to auscultation       Cardiovascular negative cardio ROS   Rhythm:regular Rate:Normal     Neuro/Psych PSYCHIATRIC DISORDERS Anxiety    GI/Hepatic Colon CA   Endo/Other    Renal/GU      Musculoskeletal   Abdominal   Peds  Hematology   Anesthesia Other Findings   Reproductive/Obstetrics                             Anesthesia Physical Anesthesia Plan  ASA: II  Anesthesia Plan: General   Post-op Pain Management:    Induction: Intravenous  PONV Risk Score and Plan: 2 and Ondansetron, Dexamethasone, Midazolam and Treatment may vary due to age or medical condition  Airway Management Planned: Oral ETT  Additional Equipment:   Intra-op Plan:   Post-operative Plan: Extubation in OR  Informed Consent: I have reviewed the patients History and Physical, chart, labs and discussed the procedure including the risks, benefits and alternatives for the proposed anesthesia with the patient or authorized representative who has indicated his/her understanding and acceptance.       Plan Discussed with: CRNA, Anesthesiologist and Surgeon  Anesthesia Plan Comments:         Anesthesia Quick Evaluation

## 2020-06-24 NOTE — Anesthesia Procedure Notes (Signed)
Procedure Name: Intubation Date/Time: 06/24/2020 7:58 AM Performed by: Lavina Hamman, CRNA Pre-anesthesia Checklist: Patient identified, Emergency Drugs available, Suction available, Patient being monitored and Timeout performed Patient Re-evaluated:Patient Re-evaluated prior to induction Oxygen Delivery Method: Circle system utilized Preoxygenation: Pre-oxygenation with 100% oxygen Induction Type: IV induction Ventilation: Mask ventilation without difficulty Laryngoscope Size: Mac and 4 Grade View: Grade II Tube type: Oral Tube size: 7.5 mm Number of attempts: 1 Airway Equipment and Method: Stylet Placement Confirmation: ETT inserted through vocal cords under direct vision,  positive ETCO2,  CO2 detector and breath sounds checked- equal and bilateral Secured at: 23 cm Tube secured with: Tape Dental Injury: Teeth and Oropharynx as per pre-operative assessment  Comments: ATOI

## 2020-06-24 NOTE — Transfer of Care (Signed)
Immediate Anesthesia Transfer of Care Note  Patient: Thomas Hunt  Procedure(s) Performed: Procedure(s): ROBOTIC SIGMOIDECTOMY, BILATERAL TAP BLOCK (N/A)  Patient Location: PACU  Anesthesia Type:General  Level of Consciousness:  sedated, patient cooperative and responds to stimulation  Airway & Oxygen Therapy:Patient Spontanous Breathing and Patient connected to face mask oxgen  Post-op Assessment:  Report given to PACU RN and Post -op Vital signs reviewed and stable  Post vital signs:  Reviewed and stable  Last Vitals:  Vitals:   06/24/20 0603  BP: (!) 146/88  Pulse: (!) 115  Resp: 18  Temp: 37.1 C  SpO2: 161%    Complications: No apparent anesthesia complications

## 2020-06-24 NOTE — Anesthesia Postprocedure Evaluation (Signed)
Anesthesia Post Note  Patient: Thomas Hunt  Procedure(s) Performed: ROBOTIC SIGMOIDECTOMY, BILATERAL TAP BLOCK (N/A Abdomen)     Patient location during evaluation: PACU Anesthesia Type: General Level of consciousness: awake and alert Pain management: pain level controlled Vital Signs Assessment: post-procedure vital signs reviewed and stable Respiratory status: spontaneous breathing, nonlabored ventilation, respiratory function stable and patient connected to nasal cannula oxygen Cardiovascular status: blood pressure returned to baseline and stable Postop Assessment: no apparent nausea or vomiting Anesthetic complications: no   No complications documented.  Last Vitals:  Vitals:   06/24/20 1305 06/24/20 1402  BP: (!) 130/82 (!) 129/78  Pulse: 82 77  Resp: 15 18  Temp: 36.6 C 37 C  SpO2: 100% 97%    Last Pain:  Vitals:   06/24/20 1402  TempSrc: Oral  PainSc:                  [place]

## 2020-06-24 NOTE — H&P (Signed)
CC: Referred by Dr. Tarri Glenn for malignant polyp  HPI: Mr. Vondrak is a very pleasant 25yoM who has a couple year history of vague abdominal/pelvic complaints. He reports ongoing issues with some degree of rectal fullness and incomplete defecation as well as a sense of a muscle twitching in his pelvic floor. He had been seen in gastroenterology, Dr. Tarri Glenn and ultimately had a colonoscopy done. He denied any history of rectal bleeding. Colonoscopy is completed 03/15/20 and demonstrated a 13 mm polyp in the sigmoid colon that was semi-pedunculated and removed with a hot snare. Resection was complete. A 2 mm polyp in the distal transverse colon removed and additionally, 10 discrete focal areas of erythema and mildly altered vascularity were seen and biopsied. 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 Transverse colon polyp return as a tubular adenoma. Biopsies showed mucosa with focus of ectatic vascular channels, mostly capillaries. Carcinoma measured 4 mm. There is no lymphovascular invasion noted. Carcinoma is present approximately 0.5 mm from the inked margin. IHC/mismatch repair protein normal. He had a subsequent CT abdomen and pelvis which demonstrated no evidence of metastatic disease. He has not completed a CT scan of his chest. He has not had a genetics referral. He had a repeat flexible sigmoidoscopy done 03/23/20 where the lesion was seen as an ulcer at 58 cm from anal verge and was tattooed. Biopsies were taken and returned normal. He denies any complaints today. He denies abdominal pain. He denies any blood in stool.  INTERVAL HX He has been doing well without complaint. Denies any changes in his health or health history. Took his prep without difficulty. States he is ready for surgery.  PMH: Denies  PSH: Denies  FHx: Denies FHx of colorectal, breast, endometrial,  ovarian or cervical cancer  Social: Denies use of tobacco/drugs; occasional social EtOH use  ROS: A comprehensive 10 system review of systems was completed with the patient and pertinent findings as noted above.  Past Medical History:  Diagnosis Date  . Anxiety   . Colon cancer ([place])   . Family history of colon cancer   . Family history of ovarian cancer   . Family history of prostate cancer   . Flat feet   . Hyperlipidemia   . Midline thoracic back pain   . Plantar warts   . Post-nasal drip     Past Surgical History:  Procedure Laterality Date  . COLONOSCOPY    . SIGMOIDOSCOPY  03/26/2020  . TONSILLECTOMY AND ADENOIDECTOMY      Family History  Problem Relation Age of Onset  . Hypertension Father   . Prostate cancer Father 26  . Colonic polyp Mother   . Prostate cancer Maternal Grandfather 80  . Lung cancer Maternal Grandfather 57       smoker  . Ovarian cancer Paternal Grandmother        fallopian tube cancer  . Prostate cancer Paternal Grandfather        d. 45  . Colon cancer Other        MGF's brother dx over 20  . Stomach cancer Neg Hx   . Rectal cancer Neg Hx   . Diabetes Neg Hx     Social:  reports that he has never smoked. He has never used smokeless tobacco. He reports current alcohol use. He reports that he does not use drugs.  Allergies: No Known Allergies  Medications: I have reviewed the patient's current medications.  Results for orders placed or performed during the hospital encounter of 06/24/20 (from the past 48 hour(s))  ABO/Rh     Status: None (Preliminary result)   Collection Time: 06/24/20  5:00 AM  Result Value Ref Range   ABO/RH(D) PENDING     No results found.  ROS - all of the below systems have been reviewed with the patient and positives are indicated with bold text General: chills, fever or night sweats Eyes: blurry vision or double vision ENT: epistaxis or sore throat Allergy/Immunology: itchy/watery eyes or nasal  congestion Hematologic/Lymphatic: bleeding problems, blood clots or swollen lymph nodes Endocrine: temperature intolerance or unexpected weight changes Breast: new or changing breast lumps or nipple discharge Resp: cough, shortness of breath, or wheezing CV: chest pain or dyspnea on exertion GI: as per HPI GU: dysuria, trouble voiding, or hematuria MSK: joint pain or joint stiffness Neuro: TIA or stroke symptoms Derm: pruritus and skin lesion changes Psych: anxiety and depression  PE Blood pressure (!) 146/88, pulse (!) 115, temperature 98.8 F (37.1 C), temperature source Oral, resp. rate 18, height _0  (1.854 m), weight (!) 94.3 kg, SpO2 100 %. Constitutional: NAD; conversant Lungs: Normal respiratory effort CV: RRR GI: Abd soft, NT/ND MSK: Normal range of motion of extremities Psychiatric: Appropriate affect; alert and oriented x3  Results for orders placed or performed during the hospital encounter of 06/24/20 (from the past 48 hour(s))  ABO/Rh     Status: None (Preliminary result)   Collection Time: 06/24/20  5:00 AM  Result Value Ref Range   ABO/RH(D) PENDING     No results found.   A/P: Mr. Giannini is a very pleasant 11yoM with recently removed polyp from colon - presumed sigmoid - returned well differentiated adenocarcinoma with close margin - 0.5 mm margin; no LVI.  CT A/P 03/30/20 - no evidence of metastatic disease  -The anatomy and physiology of the GI tract was discussed at length. The pathophysiology of colon polyps and cancer was discussed at length with as well.  -We discussed that with a margin of 0.5 mm, risk of recurrence/relapse being between approximately 20 and 34% depending on the study. We discussed that with negative biopsy results from the flexible sigmoidoscopy, this being somewhat uncharted territory. We did review recommendation for surgery going forward given the fact that the margin was so close and if this were to "recur" (persist), by the  time it is endoscopically apparent, high risks for lymphatic spread and subsequent metastases. We therefore also spent time today reviewing segmental colectomy to address the area of concern.  -We discussed that this may involve a sigmoid colectomy or left hemicolectomy depending on where the tattoo was located intraoperatively. We discussed minimally invasive/robotic and potential open techniques. -We also discussed that if he were to have a genetics panel returned positive for a colon cancer such as leg syndrome options for prophylactic surgery including total abdominal colectomy. He is not interested in pursuing any sort of "prophylactic surgery" acknowledges potential risks therein; intense surveillance. -The procedure, material risks (including, but not limited to, pain, bleeding, infection, scarring, need for blood transfusion, damage to surrounding structures- blood vessels/nerves/viscus/organs, damage to ureter, urine leak, leak from anastomosis, need for additional procedures, medication reactions, worsening of pre-existing medical conditions, need for stoma which can be permanent, hernia, recurrence, DVT/PE, pneumonia, heart attack, stroke, death) benefits and alternatives to surgery were discussed at length. The patient's questions were answered to his satisfaction, he voiced understanding. Additionally, we discussed typical postoperative expectations  and the recovery process. He has elected to proceed with surgery  Sharon Mt. Dema Severin, M.D. [facility] Use [email or web address] to contact on call provider

## 2020-06-25 LAB — BASIC METABOLIC PANEL
Anion gap: 10 (ref 5–15)
BUN: 8 mg/dL (ref 6–20)
CO2: 27 mmol/L (ref 22–32)
Calcium: 8.9 mg/dL (ref 8.9–10.3)
Chloride: 103 mmol/L (ref 98–111)
Creatinine, Ser: 0.65 mg/dL (ref 0.61–1.24)
GFR calc Af Amer: >60 mL/min (ref >60)
GFR calc non Af Amer: >60 mL/min (ref >60)
Glucose, Bld: 106 mg/dL — ABNORMAL HIGH (ref 70–99)
Potassium: 3.8 mmol/L (ref 3.5–5.1)
Sodium: 140 mmol/L (ref 135–145)

## 2020-06-25 LAB — CBC
HCT: 37 % — ABNORMAL LOW (ref 39.0–52.0)
Hemoglobin: 12.4 g/dL — ABNORMAL LOW (ref 13.0–17.0)
MCH: 28.7 pg (ref 26.0–34.0)
MCHC: 33.5 g/dL (ref 30.0–36.0)
MCV: 85.6 fL (ref 80.0–100.0)
Platelets: 276 10*3/uL (ref 150–400)
RBC: 4.32 MIL/uL (ref 4.22–5.81)
RDW: 12.6 % (ref 11.5–15.5)
WBC: 11 10*3/uL — ABNORMAL HIGH (ref 4.0–10.5)
nRBC: 0 % (ref 0.0–0.2)

## 2020-06-25 NOTE — Progress Notes (Signed)
Patient ambulated in the hallway x 2 this morning. Foley cath removed at approximately [phone] and he has voided.

## 2020-06-25 NOTE — Progress Notes (Signed)
Pharmacy Brief Note - Alvimopan (Entereg)  The standing order set for alvimopan (Entereg) now includes an automatic order to discontinue the drug after the patient has had a bowel movement. The change was approved by the [facility] and the Medical Executive Committee.   This patient has had bowel movements documented by nursing. Therefore, alvimopan has been discontinued. If there are questions, please contact the pharmacy at [phone].   Thank you- Dolly Rias RPh 06/25/2020, 10:29 AM

## 2020-06-25 NOTE — Progress Notes (Signed)
Patient refused to be ambulated at this time. Stated he will walk in the morning.

## 2020-06-25 NOTE — Progress Notes (Signed)
1 Day Post-Op Robotic Sigmoidectomy Subjective: Feeling good.  Tolerating diet, having BM's.  Ambulating in hall  Objective: Vital signs in last 24 hours: Temp:  [97.8 F (36.6 C)-98.7 F (37.1 C)] 98.7 F (37.1 C) (07/24 0921) Pulse Rate:  [64-90] 72 (07/24 0921) Resp:  [8-18] 16 (07/24 0921) BP: (119-139)/(63-83) 128/65 (07/24 0921) SpO2:  [97 %-100 %] 99 % (07/24 0921) Weight:  [94.8 kg] 94.8 kg (07/24 0627)   Intake/Output from previous day: 07/23 0701 - 07/24 0700 In: 2506.6 [P.O.:440; I.V.:2066.6] Out: 3325 [Urine:3275; Blood:50] Intake/Output this shift: Total I/O In: 240 [P.O.:240] Out: -    General appearance: alert and cooperative GI: normal findings: soft, non-tender  Incision: no significant drainage  Lab Results:  Recent Labs    06/25/20 0609  WBC 11.0*  HGB 12.4*  HCT 37.0*  PLT 276   BMET Recent Labs    06/25/20 0609  NA 140  K 3.8  CL 103  CO2 27  GLUCOSE 106*  BUN 8  CREATININE 0.65  CALCIUM 8.9   PT/INR No results for input(s): LABPROT, INR in the last 72 hours. ABG No results for input(s): PHART, HCO3 in the last 72 hours.  Invalid input(s): PCO2, PO2  MEDS, Scheduled . acetaminophen  1,000 mg Oral Q6H  . alvimopan  12 mg Oral BID  . feeding supplement  237 mL Oral BID BM  . heparin injection (subcutaneous)  5,000 Units Subcutaneous Q8H    Studies/Results: No results found.  Assessment: s/p Procedure(s): ROBOTIC SIGMOIDECTOMY, BILATERAL TAP BLOCK Patient Active Problem List   Diagnosis Date Noted  . S/P laparoscopic-assisted sigmoidectomy 06/24/2020  . Genetic testing 04/27/2020  . Family history of colon cancer   . Family history of prostate cancer   . Family history of ovarian cancer   . Colon cancer ([place])     Expected post op course  Plan: Advance diet  SL IVF's Ambulate Anticipate d/c in AM   LOS: 1 day     .Rosario Adie, [facility], [place]    06/25/2020 10:08 AM

## 2020-06-26 LAB — BASIC METABOLIC PANEL
Anion gap: 8 (ref 5–15)
BUN: 12 mg/dL (ref 6–20)
CO2: 27 mmol/L (ref 22–32)
Calcium: 8.4 mg/dL — ABNORMAL LOW (ref 8.9–10.3)
Chloride: 100 mmol/L (ref 98–111)
Creatinine, Ser: 0.82 mg/dL (ref 0.61–1.24)
GFR calc Af Amer: >60 mL/min (ref >60)
GFR calc non Af Amer: >60 mL/min (ref >60)
Glucose, Bld: 96 mg/dL (ref 70–99)
Potassium: 3.9 mmol/L (ref 3.5–5.1)
Sodium: 135 mmol/L (ref 135–145)

## 2020-06-26 LAB — CBC
HCT: 37.3 % — ABNORMAL LOW (ref 39.0–52.0)
Hemoglobin: 12.3 g/dL — ABNORMAL LOW (ref 13.0–17.0)
MCH: 28.9 pg (ref 26.0–34.0)
MCHC: 33 g/dL (ref 30.0–36.0)
MCV: 87.6 fL (ref 80.0–100.0)
Platelets: 253 10*3/uL (ref 150–400)
RBC: 4.26 MIL/uL (ref 4.22–5.81)
RDW: 12.8 % (ref 11.5–15.5)
WBC: 6 10*3/uL (ref 4.0–10.5)
nRBC: 0 % (ref 0.0–0.2)

## 2020-06-26 MED ORDER — TRAMADOL HCL 50 MG PO TABS: 50.0000 mg | ORAL_TABLET | Freq: Four times a day (QID) | ORAL | 0 refills | Status: DC | PRN

## 2020-06-26 NOTE — Plan of Care (Signed)

## 2020-06-26 NOTE — Discharge Instructions (Signed)

## 2020-06-26 NOTE — Progress Notes (Signed)
Patient is stable with well controlled pain, eating well. No complaints today. Understands all d/c instructions and preferred to walk downstairs for d/c with nurse tech. Today.

## 2020-06-26 NOTE — Discharge Summary (Signed)
Physician Discharge Summary  Patient ID: Thomas Hunt MRN: [id number] DOB/AGE: 01-13-1981 39 y.o.  Admit date: 06/24/2020 Discharge date: 06/26/2020  Admission Diagnoses: colon cancer  Discharge Diagnoses:  Active Problems:   S/P laparoscopic-assisted sigmoidectomy   Discharged Condition: good  Hospital Course: Patient was admitted after robotic assisted sigmoidectomy.  His diet was advanced as tolerated.  His Foley was removed on postop day 1.  He began to have bowel function on postop day 1.  By postop day 2 he was in stable condition and tolerating solid foods.  His pain was controlled with Tylenol and Tramadol.  He was felt to be in stable condition for discharge to home.  Consults: None  Significant Diagnostic Studies: labs: cbc, bmet  Treatments: IV hydration, analgesia: Tramadol and surgery: see above  Discharge Exam: Blood pressure (!) 113/62, pulse 57, temperature 97.7 F (36.5 C), temperature source Oral, resp. rate 14, height 6\' 1"  (1.854 m), weight 91.9 kg, SpO2 98 %. General appearance: alert and cooperative GI: normal findings: soft, non-tender Incision/Wound: clean, dry, intact  Disposition: Discharge disposition: 01-Home or Self Care        Allergies as of 06/26/2020   No Known Allergies     Medication List    TAKE these medications   traMADol 50 MG tablet Commonly known as: ULTRAM Take 1-2 tablets (50-100 mg total) by mouth every 6 (six) hours as needed (pain not controlled with ibuprofen and tylenol).       Follow-up Information    Ileana Roup, MD. Schedule an appointment as soon as possible for a visit in 2 week(s).   Specialty: General Surgery Contact information: [place] [address] [phone]               Signed: Rosario Adie 6/77/3736, 8:23 AM

## 2020-06-27 ENCOUNTER — Encounter (HOSPITAL_COMMUNITY): Payer: Self-pay | Admitting: Surgery

## 2020-06-27 LAB — SURGICAL PATHOLOGY

## 2020-07-06 ENCOUNTER — Telehealth: Payer: Self-pay

## 2020-07-06 ENCOUNTER — Other Ambulatory Visit: Payer: Self-pay

## 2020-07-06 NOTE — Telephone Encounter (Signed)
-----   Message from Thornton Park, MD sent at 07/06/2020 12:30 PM EDT ----- Regarding: FW: Follow-up Please enter for surveillance colonoscopy 06/2021.  Thank you.  KLB ----- Message ----- From: Ileana Roup, MD Sent: 07/06/2020  10:13 AM EDT To: Thornton Park, MD Subject: Follow-up                                      Hi [place] things are going great. We got Thomas Hunt taken care of - ended up doing robotic sigmoidectomy and he has done great. Pathology showed no residual findings or involvement of the lymph nodes. Overall, stage I and we are all thankful he was caught so early. 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 I do think given his age it would be reasonable to treat him like our hereditary cancer patients with enhanced surveillance scopes. I plan to see him back for cancer surveillance f/u in the office ~q6 mo for next 5 years but assumed you'd follow endoscopically.  I hope you enjoy(ed) your vacation!  Thomas Hunt

## 2020-07-06 NOTE — Telephone Encounter (Signed)
Recall adjusted in epic.

## 2020-07-25 ENCOUNTER — Telehealth: Payer: Self-pay

## 2020-07-25 NOTE — Telephone Encounter (Signed)
-----   Message from Thornton Park, MD sent at 07/22/2020  4:04 PM EDT ----- Surgical for close follow-up notes reviewed from Dr. Dema Severin.  The patient is doing well after segmental colectomy.  Please be sure that he is in surveillance for a colonoscopy 06/2021.  Thank you.

## 2020-07-25 NOTE — Telephone Encounter (Signed)
Recall in epic for 06/2021

## 2021-01-04 ENCOUNTER — Telehealth: Payer: Self-pay | Admitting: Gastroenterology

## 2021-01-04 NOTE — Telephone Encounter (Signed)
Records from Dr. Nadeen Landau reviewed.  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  Surveillance colonoscopy due 7/22. Please change recall and arrange for a colonoscopy at that time. Thanks.

## 2021-01-04 NOTE — Telephone Encounter (Signed)
Recall is already in epic for 06/2021 

## 2021-06-02 ENCOUNTER — Encounter: Payer: Self-pay | Admitting: Gastroenterology

## 2021-08-01 ENCOUNTER — Other Ambulatory Visit: Payer: Self-pay

## 2021-08-01 ENCOUNTER — Other Ambulatory Visit: Payer: Self-pay | Admitting: Gastroenterology

## 2021-08-01 ENCOUNTER — Ambulatory Visit (AMBULATORY_SURGERY_CENTER): Payer: BC Managed Care – PPO

## 2021-08-01 VITALS — Ht 73.0 in | Wt 225.0 lb

## 2021-08-01 DIAGNOSIS — Z85038 Personal history of other malignant neoplasm of large intestine: Secondary | ICD-10-CM

## 2021-08-01 MED ORDER — PLENVU 140 G PO SOLR: 1.0000 | ORAL | 0 refills | Status: DC

## 2021-08-01 NOTE — Progress Notes (Signed)
Patient's pre-visit was done today over the phone with the patient due to COVID-19 pandemic. Name,DOB and address verified. Insurance verified. Patient denies any allergies to Eggs and Soy. Patient denies any problems with anesthesia/sedation. Patient denies taking diet pills or blood thinners. No home Oxygen. Packet of Prep instructions mailed to patient including a copy of a consent form-pt is aware. Patient understands to call us back with any questions or concerns. Patient is aware of our care-partner policy and 0000000 safety protocol.   EMMI education assigned to the patient for the procedure, sent to [facility].   The patient is COVID-19 vaccinated.    Plenvu coupon was sent to pt's pharmacy.

## 2021-08-09 ENCOUNTER — Encounter: Payer: Self-pay | Admitting: Gastroenterology

## 2021-08-11 ENCOUNTER — Telehealth: Payer: Self-pay | Admitting: Gastroenterology

## 2021-08-11 DIAGNOSIS — Z85038 Personal history of other malignant neoplasm of large intestine: Secondary | ICD-10-CM

## 2021-08-11 MED ORDER — CLENPIQ 10-3.5-12 MG-GM -GM/160ML PO SOLN: 1.0000 | ORAL | 0 refills | Status: DC

## 2021-08-11 NOTE — Telephone Encounter (Signed)
Patient request a cheaper prep. I did go over the miralax and Golytely. I offered Clenpiq-this may covered by his insurance. Patient went with clenpiq. New Rx sent to pharmacy and new prep instructions sent to pt via Mychart-pt is aware.  Patient aware to only pick up ONE RX prep.

## 2021-08-11 NOTE — Telephone Encounter (Signed)
Patient called requesting an alternative prep due to cost for the plenvu

## 2021-08-16 ENCOUNTER — Encounter: Payer: Self-pay | Admitting: Gastroenterology

## 2021-08-16 ENCOUNTER — Other Ambulatory Visit: Payer: Self-pay

## 2021-08-16 ENCOUNTER — Ambulatory Visit (AMBULATORY_SURGERY_CENTER): Payer: BC Managed Care – PPO | Admitting: Gastroenterology

## 2021-08-16 VITALS — BP 120/72 | HR 85 | Temp 98.4°F | Resp 10 | Ht 73.0 in | Wt 225.0 lb

## 2021-08-16 DIAGNOSIS — K6389 Other specified diseases of intestine: Secondary | ICD-10-CM

## 2021-08-16 DIAGNOSIS — K635 Polyp of colon: Secondary | ICD-10-CM | POA: Diagnosis not present

## 2021-08-16 DIAGNOSIS — Z85038 Personal history of other malignant neoplasm of large intestine: Secondary | ICD-10-CM | POA: Diagnosis present

## 2021-08-16 DIAGNOSIS — D122 Benign neoplasm of ascending colon: Secondary | ICD-10-CM

## 2021-08-16 MED ORDER — SODIUM CHLORIDE 0.9 % IV SOLN
500.0000 mL | Freq: Once | INTRAVENOUS | Status: DC
Start: 2021-08-16 — End: 2021-08-16

## 2021-08-16 NOTE — Progress Notes (Signed)
Pt's states no medical or surgical changes since previsit or office visit.   Check-in-aer  V/s-dt

## 2021-08-16 NOTE — Op Note (Signed)
[facility] Patient Name: Thomas Hunt Procedure Date: 08/16/2021 10:48 AM MRN: [id number] Endoscopist: Thornton Park MD, MD Age: 40 Referring MD:  Date of Birth: 08-31-1981 Gender: Male Account #: [id number] Procedure:                Colonoscopy Indications:              High risk colon cancer surveillance: Personal                            history of colon cancer                           Stage 1 adenocarcinoma of the sigmoid colonoscopy                            diagnosed 2011, s/p resection                           Sister with colon polyps                           No known family history of colon cancer or polyps Medicines:                Monitored Anesthesia Care Procedure:                Pre-Anesthesia Assessment:                           - Prior to the procedure, a History and Physical                            was performed, and patient medications and                            allergies were reviewed. The patient's tolerance of                            previous anesthesia was also reviewed. The risks                            and benefits of the procedure and the sedation                            options and risks were discussed with the patient.                            All questions were answered, and informed consent                            was obtained. Prior Anticoagulants: The patient has                            taken no previous anticoagulant or antiplatelet  agents. ASA Grade Assessment: II - A patient with                            mild systemic disease. After reviewing the risks                            and benefits, the patient was deemed in                            satisfactory condition to undergo the procedure.                           After obtaining informed consent, the colonoscope                            was passed under direct vision. Throughout the                            procedure,  the patient's blood pressure, pulse, and                            oxygen saturations were monitored continuously. The                            CF [id number] was introduced through the anus                            and advanced to the 3 cm into the ileum. A second                            forward view of the right colon was performed. The                            colonoscopy was performed without difficulty. The                            patient tolerated the procedure well. The quality                            of the bowel preparation was excellent. The                            terminal ileum, ileocecal valve, appendiceal                            orifice, and rectum were photographed. Scope In: 10:54:06 AM Scope Out: 11:04:29 AM Scope Withdrawal Time: 0 hours 8 minutes 6 seconds  Total Procedure Duration: 0 hours 10 minutes 23 seconds  Findings:                 The perianal and digital rectal examinations were                            normal.  There was evidence of a prior end-to-end                            colo-colonic anastomosis in the distal sigmoid                            colon. This was patent and was characterized by                            healthy appearing mucosa. Biopsies were taken with                            a cold forceps for histology. Estimated blood loss                            was minimal.                           A 1 mm polyp was found in the proximal ascending                            colon. The polyp was sessile. The polyp was removed                            with a cold snare. Resection and retrieval were                            complete. Estimated blood loss was minimal.                           The exam was otherwise without abnormality on                            direct and retroflexion views. Complications:            No immediate complications. Estimated blood loss:                             Minimal. Estimated Blood Loss:     Estimated blood loss was minimal. Impression:               - Patent end-to-end colo-colonic anastomosis,                            characterized by healthy appearing mucosa. Biopsied.                           - One 1 mm polyp in the proximal ascending colon,                            removed with a cold snare. Resected and retrieved.                           - The examination was otherwise normal on direct  and retroflexion views. Recommendation:           - Patient has a contact number available for                            emergencies. The signs and symptoms of potential                            delayed complications were discussed with the                            patient. Return to normal activities tomorrow.                            Written discharge instructions were provided to the                            patient.                           - Resume previous diet.                           - Continue present medications.                           - Await pathology results.                           - Repeat colonoscopy in 3 years for surveillance                            given the history of colon cancer.                           - Emerging evidence supports eating a diet of                            fruits, vegetables, grains, calcium, and yogurt                            while reducing red meat and alcohol may reduce the                            risk of colon cancer.                           - Thank you for allowing me to be involved in your                            colon cancer prevention. Thornton Park MD, MD 08/16/2021 11:09:57 AM This report has been signed electronically.

## 2021-08-16 NOTE — Progress Notes (Signed)
   Referring Provider: Shon Baton, MD Primary Care Physician:  Shon Baton, MD  Reason for Consultation:  Colon cancer screening   IMPRESSION:  Personal history of colon cancer - s/p resection 7/21  He is an appropriate candidate for anesthesia in the [place]  PLAN: Colonoscopy in the [place] today   HPI: Thomas Hunt is a 41 y.o. male presents for surveillance colonoscopy. Diagnosed with stage 1 adenocarcinoma at time of colonoscopy 7/21. Returns for surveillance.  Younger sister had colon polyps on a colonoscopy last year. No other known family history of colon cancer or polyps. Patient has some symptoms that have him wondering about pelvic floor dysfunction. No new GI symptoms.    Past Medical History:  Diagnosis Date   Anxiety    Colon cancer ([place])    Family history of colon cancer    Family history of ovarian cancer    Family history of prostate cancer    Flat feet    Hyperlipidemia    Midline thoracic back pain    Plantar warts    Post-nasal drip     Past Surgical History:  Procedure Laterality Date   COLON SURGERY     COLONOSCOPY     SIGMOIDOSCOPY  03/26/2020   TONSILLECTOMY AND ADENOIDECTOMY      Current Outpatient Medications  Medication Sig Dispense Refill   ibuprofen (ADVIL) 200 MG tablet Take 200 mg by mouth every 6 (six) hours as needed.     Current Facility-Administered Medications  Medication Dose Route Frequency Provider Last Rate Last Admin   0.9 %  sodium chloride infusion  500 mL Intravenous Once Thornton Park, MD        Allergies as of 08/16/2021   (No Known Allergies)    Family History  Problem Relation Age of Onset   Colonic polyp Mother    Hypertension Father    Prostate cancer Father 16   Prostate cancer Maternal Grandfather 7   Lung cancer Maternal Grandfather 74       smoker   Ovarian cancer Paternal Grandmother        fallopian tube cancer   Prostate cancer Paternal Grandfather        d. 85   Colon cancer Other        MGF's  brother dx over 60   Stomach cancer Neg Hx    Rectal cancer Neg Hx    Esophageal cancer Neg Hx      Physical Exam: General:   Alert,  well-nourished, pleasant and cooperative in NAD Head:  Normocephalic and atraumatic. Eyes:  Sclera clear, no icterus.   Conjunctiva pink. Mouth:  No deformity or lesions.   Neck:  Supple; no masses or thyromegaly. Lungs:  Clear throughout to auscultation.   No wheezes. Heart:  Regular rate and rhythm; no murmurs. Abdomen:  Soft, non-tender, nondistended, normal bowel sounds, no rebound or guarding.  Msk:  Symmetrical. No boney deformities LAD: No inguinal or umbilical LAD Extremities:  No clubbing or edema. Neurologic:  Alert and  oriented x4;  grossly nonfocal Skin:  No obvious rash or bruise. Psych:  Alert and cooperative. Normal mood and affect.    Klara Stjames L. Tarri Glenn, MD, MPH 08/16/2021, 10:45 AM

## 2021-08-16 NOTE — Progress Notes (Signed)
Called to room to assist during endoscopic procedure.  Patient ID and intended procedure confirmed with present staff. Received instructions for my participation in the procedure from the performing physician.  

## 2021-08-16 NOTE — Patient Instructions (Signed)
Thank you for allowing [place] to care for you today! Await final result of polyp removed, approximately 2 weeks.   Recommend surveillance colonoscopy in 3 years. Resume previous diet and medications today. Return to normal daily activities tomorrow.  YOU HAD AN ENDOSCOPIC PROCEDURE TODAY AT [facility]:   Refer to the procedure report that was given to you for any specific questions about what was found during the examination.  If the procedure report does not answer your questions, please call your gastroenterologist to clarify.  If you requested that your care partner not be given the details of your procedure findings, then the procedure report has been included in a sealed envelope for you to review at your convenience later.  YOU SHOULD EXPECT: Some feelings of bloating in the abdomen. Passage of more gas than usual.  Walking can help get rid of the air that was put into your GI tract during the procedure and reduce the bloating. If you had a lower endoscopy (such as a colonoscopy or flexible sigmoidoscopy) you may notice spotting of blood in your stool or on the toilet paper. If you underwent a bowel prep for your procedure, you may not have a normal bowel movement for a few days.  Please Note:  You might notice some irritation and congestion in your nose or some drainage.  This is from the oxygen used during your procedure.  There is no need for concern and it should clear up in a day or so.  SYMPTOMS TO REPORT IMMEDIATELY:  Following lower endoscopy (colonoscopy or flexible sigmoidoscopy):  Excessive amounts of blood in the stool  Significant tenderness or worsening of abdominal pains  Swelling of the abdomen that is new, acute  Fever of 100F or higher    For urgent or emergent issues, a gastroenterologist can be reached at any hour by calling [phone]. Do not use MyChart messaging for urgent concerns.    DIET:  We do recommend a small meal at first, but then you  may proceed to your regular diet.  Drink plenty of fluids but you should avoid alcoholic beverages for 24 hours.  ACTIVITY:  You should plan to take it easy for the rest of today and you should NOT DRIVE or use heavy machinery until tomorrow (because of the sedation medicines used during the test).    FOLLOW UP: Our staff will call the number listed on your records 48-72 hours following your procedure to check on you and address any questions or concerns that you may have regarding the information given to you following your procedure. If we do not reach you, we will leave a message.  We will attempt to reach you two times.  During this call, we will ask if you have developed any symptoms of COVID 19. If you develop any symptoms (ie: fever, flu-like symptoms, shortness of breath, cough etc.) before then, please call [phone].  If you test positive for Covid 19 in the 2 weeks post procedure, please call and report this information to [place].    If any biopsies were taken you will be contacted by phone or by letter within the next 1-3 weeks.  Please call us at [phone] if you have not heard about the biopsies in 3 weeks.    SIGNATURES/CONFIDENTIALITY: You and/or your care partner have signed paperwork which will be entered into your electronic medical record.  These signatures attest to the fact that that the information above on your After Visit Summary  has been reviewed and is understood.  Full responsibility of the confidentiality of this discharge information lies with you and/or your care-partner.  

## 2021-08-16 NOTE — Progress Notes (Signed)
Report to PACU, RN, vss, BBS= Clear.  

## 2021-08-18 ENCOUNTER — Telehealth: Payer: Self-pay

## 2021-08-18 NOTE — Telephone Encounter (Signed)
  Follow up Call-  Call back number 08/16/2021 03/23/2020 03/15/2020  Post procedure Call Back phone  # [phone]  Permission to leave phone message Yes Yes Yes  Some recent data might be hidden     Patient questions:  Do you have a fever, pain , or abdominal swelling? No. Pain Score  0 *  Have you tolerated food without any problems? Yes.    Have you been able to return to your normal activities? Yes.    Do you have any questions about your discharge instructions: Diet   No. Medications  No. Follow up visit  No.  Do you have questions or concerns about your Care? No.  Actions: * If pain score is 4 or above: No action needed, pain <4.  Have you developed a fever since your procedure? no  2.   Have you had an respiratory symptoms (SOB or cough) since your procedure? no  3.   Have you tested positive for COVID 19 since your procedure no  4.   Have you had any family members/close contacts diagnosed with the COVID 19 since your procedure?  no   If yes to any of these questions please route to Joylene John, RN and Joella Prince, RN

## 2021-08-21 ENCOUNTER — Other Ambulatory Visit: Payer: Self-pay

## 2021-08-21 ENCOUNTER — Encounter: Payer: Self-pay | Admitting: Gastroenterology

## 2021-08-21 ENCOUNTER — Telehealth: Payer: Self-pay

## 2021-08-21 DIAGNOSIS — M6289 Other specified disorders of muscle: Secondary | ICD-10-CM

## 2021-08-21 NOTE — Telephone Encounter (Signed)
-----   Message from Thornton Park, MD sent at 08/16/2021 11:39 AM EDT ----- Please arrange referral to [facility] for for pelvic floor dysfunction. Thank you!

## 2021-08-21 NOTE — Telephone Encounter (Signed)
Referral placed for Pelvic Therapy to See Thomas Hunt.

## 2021-08-28 ENCOUNTER — Encounter: Payer: Self-pay | Admitting: Physical Therapy

## 2021-08-28 ENCOUNTER — Other Ambulatory Visit: Payer: Self-pay

## 2021-08-28 ENCOUNTER — Ambulatory Visit: Payer: BC Managed Care – PPO | Attending: Gastroenterology | Admitting: Physical Therapy

## 2021-08-28 DIAGNOSIS — R293 Abnormal posture: Secondary | ICD-10-CM | POA: Insufficient documentation

## 2021-08-28 DIAGNOSIS — R279 Unspecified lack of coordination: Secondary | ICD-10-CM | POA: Insufficient documentation

## 2021-08-28 DIAGNOSIS — M6281 Muscle weakness (generalized): Secondary | ICD-10-CM | POA: Diagnosis not present

## 2021-08-28 NOTE — Patient Instructions (Signed)
About Constipation  Constipation Overview Constipation is the most common gastrointestinal complaint -- about 4 million Americans experience constipation and make 2.5 million physician visits a year to get help for the problem.  Constipation can occur when the colon absorbs too much water, the colon's muscle contraction is slow or sluggish, and/or there is delayed transit time through the colon.  The result is stool that is hard and dry.  Indicators of constipation include straining during bowel movements greater than 25% of the time, having fewer than three bowel movements per week, and/or the feeling of incomplete evacuation.  There are established guidelines ([place] II ) for defining constipation. A person needs to have two or more of the following symptoms for at least 12 weeks (not necessarily consecutive) in the preceding 12 months: Straining in  greater than 25% of bowel movements Lumpy or hard stools in greater than 25% of bowel movements Sensation of incomplete emptying in greater than 25% of bowel movements Sensation of anorectal obstruction/blockade in greater than 25% of bowel movements Manual maneuvers to help empty greater than 25% of bowel movements (e.g., digital evacuation, support of the pelvic floor)  Less than  3 bowel movements/week Loose stools are not present, and criteria for irritable bowel syndrome are insufficient Common Causes of Constipation lack of fiber in your diet lack of physical activity medications, including iron and calcium supplements  dairy intake dehydration abuse of laxatives travel Irritable Bowel Syndrome pregnancy luteal phase of menstruation (after ovulation and before menses) colorectal problems intestinal dysfunction  Treating Constipation  There are several ways of treating constipation, including changes to diet and exercise, use of laxatives, adjustments to the pelvic floor, and scheduled toileting.  These treatments include: increasing  fiber and fluids in the diet  increasing physical activity learning muscle coordination  learning proper toileting techniques and toileting modifications  designing and sticking  to a toileting schedule

## 2021-08-28 NOTE — Therapy (Signed)
[facility] [address], [place], [place], [address] Phone: [phone]   Fax:  [phone]  Physical Therapy Evaluation  Patient Details  Name: Thomas Hunt MRN: [id number] Date of Birth: [birth year] Referring Provider (PT): Thornton Park, MD   Encounter Date: 08/28/2021   PT End of Session - 08/28/21 1639     Visit Number 1    Date for PT Re-Evaluation 11/27/21    Authorization Type BCBS    PT Start Time 1146    PT Stop Time 1226    PT Time Calculation (min) 40 min    Activity Tolerance Patient tolerated treatment well    Behavior During Therapy [facility] for tasks assessed/performed             Past Medical History:  Diagnosis Date   Anxiety    Colon cancer ([place])    Family history of colon cancer    Family history of ovarian cancer    Family history of prostate cancer    Flat feet    Hyperlipidemia    Midline thoracic back pain    Plantar warts    Post-nasal drip     Past Surgical History:  Procedure Laterality Date   COLON SURGERY     COLONOSCOPY     SIGMOIDOSCOPY  03/26/2020   TONSILLECTOMY AND ADENOIDECTOMY      There were no vitals filed for this visit.    Subjective Assessment - 08/28/21 1152     Subjective Pt reports previous year history of hemorrhoids, rectal spasms, and intermitent fecal leakage and also feels like he has had a chronic/lifelong history of incomplete bowel evacuation. Pt reports daily fecal leakage with small amount, does not wear pads. Denies constipation, pain. For the last 3 years daily BMs, prior to this every 2-3x per week. Pt reports at least 8 wipes to clean after BM each time.    Pertinent History stage 1 adenocarcinoma at time of colonoscopy 7/21    How long can you sit comfortably? no limits    How long can you stand comfortably? no limits    How long can you walk comfortably? no limits    Diagnostic tests colonoscopy and polyp removal    Patient Stated  Goals to have more regular and complete bowel movement    Currently in Pain? No/denies                [facility] PT Assessment - 08/28/21 0001       Assessment   Medical Diagnosis M62.89 (ICD-10-CM) - Pelvic floor dysfunction    Referring Provider (PT) Thornton Park, MD    Onset Date/Surgical Date --   at least 2-3 years   Prior Therapy No      Precautions   Precautions None      Restrictions   Weight Bearing Restrictions No      Balance Screen   Has the patient fallen in the past 6 months No    Has the patient had a decrease in activity level because of a fear of falling?  No    Is the patient reluctant to leave their home because of a fear of falling?  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      Cognition   Overall Cognitive Status Within Functional Limits for tasks assessed      Sensation   Light Touch Appears Intact      Coordination   Gross Motor Movements are Fluid and Coordinated Yes    Fine Motor Movements are Fluid and Coordinated Yes      Posture/Postural Control   Posture/Postural Control No significant limitations      ROM / Strength   AROM / PROM / Strength AROM;Strength      AROM   Overall AROM Comments thoracic and lumbar limited by 50% in all directions      Strength   Overall Strength Comments bil hips 5/5 in all directions      Flexibility   Soft Tissue Assessment /Muscle Length yes   bil hamstrings and adductors limited by 50%     Palpation   Palpation comment noted fascial tightness thorughout all quadrants of abdomen without pain                        Objective measurements completed on examination: See above findings.     Pelvic Floor Special Questions -  08/28/21 0001     Prior Pelvic/Prostate Exam Yes    Date of Last Pelvic/Prostate Exam --   no additional findings post polp removals   Currently Sexually Active Yes    Is this Painful No    History of sexually transmitted disease No    Urinary Leakage Yes    How often fairly consistent dripping after urination but no leakage between voids    Pad use no    Urinary urgency No    Urinary frequency no    Fecal incontinence Yes   daily fecal smearing found in underwear and thinks it mostly occurs with prolonged sitting and less often when able to stand or walk more. no pad use.Type 4 on bristol stool scale usually, and starts without problems but feels he doesn't complete void   Caffeine beverages yes-3 caffinated drinks per day    Falling out feeling (prolapse) No    Pelvic Floor Internal Exam deferred by pt at this time                       PT Education - 08/28/21 1638     Education Details Pt educated on exam findings, POC, constipation and abdominal massage handout given and diaphragmatic breathing voiding mechanics    Person(s) Educated Patient    Methods Explanation;Demonstration;Verbal cues;Handout;Tactile cues    Comprehension Returned demonstration;Verbalized understanding              PT Short Term Goals - 08/28/21 1645       PT SHORT TERM GOAL #1   Title Pt to be I with HEP    Time 4    Period Weeks    Status New    Target Date 09/25/21      PT SHORT TERM GOAL #2   Title pt to demonstrate good ability to coordinate pelvic floor contract/relax at least 75% of the time to improve full bowel evacuation    Time 4    Period Weeks    Status New    Target Date 09/25/21      PT SHORT TERM GOAL #3   Title pt to demonstrate good recall of voiding mechanics and breathing mechanics to relax pelvic floor for complete bowel movements.  Time 4    Period Weeks    Status New    Target Date 09/25/21               PT Long Term Goals - 08/28/21 1649        PT LONG TERM GOAL #1   Title Pt to be I with advanced HEP    Time 3    Period Months    Status New    Target Date 11/27/21      PT LONG TERM GOAL #2   Title pt to demonstrate good ability to coordinate pelvic floor contract/relax at least 90% of the time to improve full bowel evacuation    Time 3    Period Months    Status New    Target Date 11/27/21      PT LONG TERM GOAL #3   Title pt to report at least 75% improvement in incomplete voiding feeling for bowel movements.    Time 3    Period Months    Status New    Target Date 11/27/21      PT LONG TERM GOAL #4   Title pt to demonstrate at least 4/5 contraction strength at EAS to decrease fecal leakage.    Time 3    Period Months    Status New    Target Date 11/27/21                    Plan - 08/28/21 1640     Clinical Impression Statement Pt is 40 yo male presenting with at least a 2-3 year history of fecal leakage, hemorrhoids, rectal spasms, polyp removal with (+) colon CA finding last year and also feels like he has had a chronic/lifelong history of incomplete bowel evacuation. Pt reports daily fecal leakage with small amount and he feels this is mostly with prolonged sitting, does not wear pads. Denies constipation but does endorse incomplete voids, pain. For the last 3 years daily BMs, prior to this every 2-3x per week and on average Type 4 on Bristol Stool. Pt reports at least 8 wipes to clean after BM each time and this has been lifelong per pt. Pt deferred internal exam this date but educated on process and reasoning and pt reported he understood but would like to wait and see if there can be improvement without this initially and if not then agreeable to attempt. Pt educated on bowel voiding mechanics with step stool, breathing mechanics, abdominal massage and constipation handout given. Pt verbalized understanding and demonstrated ability to return technique for voiding position on step stool. Pt found to  have tightness throughout abdomen, no pain, decreased mobility in hips and spine with gross assessment.    Personal Factors and Comorbidities Time since onset of injury/illness/exacerbation;Comorbidity 2    Comorbidities hemorrhoids, polyp removal with (+) colon CA finding last year, has since been clear    Examination-Activity Limitations Continence    Examination-Participation Restrictions Interpersonal Relationship;Community Activity    Stability/Clinical Decision Making Stable/Uncomplicated    Clinical Decision Making Low    Rehab Potential Good    PT Frequency Biweekly    PT Duration --   6 visits   PT Treatment/Interventions ADLs/Self Care Home Management;Functional mobility training;Therapeutic activities;Therapeutic exercise;Neuromuscular re-education;Manual techniques;Patient/family education;Scar mobilization;Passive range of motion;Energy conservation;Taping    PT Next Visit Plan go over handouts and abdominal massage    PT Home Exercise Plan voiding mechanics, breathing mechanics    Consulted and Agree with Plan of Care  Patient             Patient will benefit from skilled therapeutic intervention in order to improve the following deficits and impairments:  Decreased coordination, Increased fascial restricitons, Impaired tone, Increased muscle spasms, Pain, Improper body mechanics, Impaired flexibility, Decreased scar mobility, Decreased mobility, Decreased strength, Postural dysfunction  Visit Diagnosis: Muscle weakness (generalized) - Plan: PT plan of care cert/re-cert  Lack of coordination - Plan: PT plan of care cert/re-cert  Abnormal posture - Plan: PT plan of care cert/re-cert     Problem List Patient Active Problem List   Diagnosis Date Noted   S/P laparoscopic-assisted sigmoidectomy 06/24/2020   Genetic testing 04/27/2020   Family history of colon cancer    Family history of prostate cancer    Family history of ovarian cancer    Colon cancer ([place])     Stacy Gardner, PT, DPT 09/26/224:53 PM    [facility] [address] [place], [place], [address] Phone: [phone]   Fax:  [phone]  Name: Suleyman Ehrman MRN: [id number] Date of Birth: Sep 03, 1981

## 2021-09-13 ENCOUNTER — Encounter: Payer: Self-pay | Admitting: Physical Therapy

## 2021-09-13 ENCOUNTER — Ambulatory Visit: Payer: BC Managed Care – PPO | Attending: Gastroenterology | Admitting: Physical Therapy

## 2021-09-13 ENCOUNTER — Other Ambulatory Visit: Payer: Self-pay

## 2021-09-13 DIAGNOSIS — R279 Unspecified lack of coordination: Secondary | ICD-10-CM | POA: Diagnosis not present

## 2021-09-13 DIAGNOSIS — M6281 Muscle weakness (generalized): Secondary | ICD-10-CM | POA: Insufficient documentation

## 2021-09-13 NOTE — Therapy (Addendum)
[facility] @ [place], [place], [address] Phone: [phone]   Fax:  [phone]  Physical Therapy Treatment  Patient Details  Name: Thomas Hunt MRN: [id number] Date of Birth: 1981/10/14 Referring Provider (PT): Thornton Park, MD   Encounter Date: 09/13/2021   PT End of Session - 09/13/21 1311     Visit Number 2    Date for PT Re-Evaluation 11/27/21    Authorization Type BCBS    PT Start Time 1230    PT Stop Time 1304    PT Time Calculation (min) 34 min    Activity Tolerance Patient tolerated treatment well    Behavior During Therapy [facility] for tasks assessed/performed             Past Medical History:  Diagnosis Date   Anxiety    Colon cancer ([place])    Family history of colon cancer    Family history of ovarian cancer    Family history of prostate cancer    Flat feet    Hyperlipidemia    Midline thoracic back pain    Plantar warts    Post-nasal drip     Past Surgical History:  Procedure Laterality Date   COLON SURGERY     COLONOSCOPY     SIGMOIDOSCOPY  03/26/2020   TONSILLECTOMY AND ADENOIDECTOMY      There were no vitals filed for this visit.   Subjective Assessment - 09/13/21 1234     Subjective Pt reports he has been using squatty potty and this has helped with ability to fully evacuate however not consistent. When he is able to a full evacuation does not have leakage. Pt reports 30% of the time full evacuation and no leakage. Usually 6 wipes needed to clean post BM. Bristol stool type 4 usually.    Pertinent History stage 1 adenocarcinoma at time of colonoscopy 7/21    How long can you sit comfortably? no limits    How long can you stand comfortably? no limits    How long can you walk comfortably? no limits    Diagnostic tests colonoscopy and polyp removal    Patient Stated Goals to have more regular and complete bowel movement    Currently in Pain? No/denies                                [facility] Adult PT Treatment/Exercise - 09/13/21 0001       Self-Care   Self-Care Other Self-Care Comments    Other Self-Care Comments  Pt educated on fiber types, voiding mechanics with positioning and use of squatty potty, breathing mechanics, pelvic tilts at toliet to improve mobility and increase instances of complete evacuation. Pt also educated on bladder irritants as he reported some urgency in the last year and given bladder irritant handout. Pt denied internal rectal exam at this time but agreeable to attempt pelvic relaxation video and continue abdominal massage to improve mobility of stool in colon into rectum.      Exercises   Exercises Knee/Hip;Lumbar      Lumbar Exercises: Stretches   Pelvic Tilt Limitations pool noodle x10 Rt/LT                     PT Education - 09/13/21 1310     Education Details Pt educated on bladder irritants, pelvic relaxation video, breathing and voiding mechanics, pelvic tilts, male anatomy with  model used.    Person(s) Educated Patient    Methods Explanation;Demonstration;Tactile cues;Verbal cues;Handout    Comprehension Verbalized understanding;Returned demonstration              PT Short Term Goals - 08/28/21 1645       PT SHORT TERM GOAL #1   Title Pt to be I with HEP    Time 4    Period Weeks    Status New    Target Date 09/25/21      PT SHORT TERM GOAL #2   Title pt to demonstrate good ability to coordinate pelvic floor contract/relax at least 75% of the time to improve full bowel evacuation    Time 4    Period Weeks    Status New    Target Date 09/25/21      PT SHORT TERM GOAL #3   Title pt to demonstrate good recall of voiding mechanics and breathing mechanics to relax pelvic floor for complete bowel movements.    Time 4    Period Weeks    Status New    Target Date 09/25/21               PT Long Term Goals - 08/28/21 1649       PT LONG TERM GOAL #1   Title Pt  to be I with advanced HEP    Time 3    Period Months    Status New    Target Date 11/27/21      PT LONG TERM GOAL #2   Title pt to demonstrate good ability to coordinate pelvic floor contract/relax at least 90% of the time to improve full bowel evacuation    Time 3    Period Months    Status New    Target Date 11/27/21      PT LONG TERM GOAL #3   Title pt to report at least 75% improvement in incomplete voiding feeling for bowel movements.    Time 3    Period Months    Status New    Target Date 11/27/21      PT LONG TERM GOAL #4   Title pt to demonstrate at least 4/5 contraction strength at EAS to decrease fecal leakage.    Time 3    Period Months    Status New    Target Date 11/27/21                   Plan - 09/13/21 1312     Clinical Impression Statement Pt presents to clinic reporting he feels that he has made some progress with introduction of squatty potty ~30% of the time able to feel full evacation of stool and no straining. Pt educated on mechanics for breathing and voiding, pelvic tilts at toliet, abdminal massage with CW/CCW and ILU technique, fiber types and reported he has felt increased urgency to ~2-3hours which he feels is more than he used to go, given bladder irritant handout. Pt denied additional questions at end of session. Pt would benefit from additional PT to address deficits found.    Personal Factors and Comorbidities Time since onset of injury/illness/exacerbation;Comorbidity 2    Comorbidities hemorrhoids, polyp removal with (+) colon CA finding last year, has since been clear    Examination-Activity Limitations Continence    Examination-Participation Restrictions Interpersonal Relationship;Community Activity    Stability/Clinical Decision Making Stable/Uncomplicated    Clinical Decision Making Low    Rehab Potential Good    PT Frequency Biweekly  PT Duration 12 weeks    PT Treatment/Interventions ADLs/Self Care Home Management;Functional  mobility training;Therapeutic activities;Therapeutic exercise;Neuromuscular re-education;Manual techniques;Patient/family education;Scar mobilization;Passive range of motion;Energy conservation;Taping    PT Next Visit Plan abdominal massage, pelvic relaxation    PT Home Exercise Plan voiding mechanics, breathing mechanics    Consulted and Agree with Plan of Care Patient             Patient will benefit from skilled therapeutic intervention in order to improve the following deficits and impairments:  Decreased coordination, Increased fascial restricitons, Impaired tone, Increased muscle spasms, Pain, Improper body mechanics, Impaired flexibility, Decreased scar mobility, Decreased mobility, Decreased strength, Postural dysfunction  Visit Diagnosis: Lack of coordination  Muscle weakness (generalized)     Problem List Patient Active Problem List   Diagnosis Date Noted   S/P laparoscopic-assisted sigmoidectomy 06/24/2020   Genetic testing 04/27/2020   Family history of colon cancer    Family history of prostate cancer    Family history of ovarian cancer    Colon cancer ([place])    Stacy Gardner, PT, DPT 10/12/221:19 PM   PHYSICAL THERAPY DISCHARGE SUMMARY  Visits from Start of Care: 2  Current functional level related to goals / functional outcomes: Pt requested to DC from PT at this time so unable to formally reassess   Remaining deficits: Pt requested to DC from PT at this time so unable to formally reassess   Education / Equipment: HEP   Patient agrees to discharge. Patient goals were partially met. Patient is being discharged due to the patient's request. Thank you for the referral.    [facility] @ [place], [place], [address] Phone: [phone]   Fax:  [phone]  Name: Thomas Hunt MRN: [id number] Date of Birth: May 09, 1981

## 2021-09-13 NOTE — Patient Instructions (Addendum)

## 2021-09-27 ENCOUNTER — Encounter: Payer: BC Managed Care – PPO | Admitting: Physical Therapy

## 2021-10-11 ENCOUNTER — Encounter: Payer: BC Managed Care – PPO | Admitting: Physical Therapy

## 2021-11-01 ENCOUNTER — Encounter: Payer: BC Managed Care – PPO | Admitting: Physical Therapy

## 2021-11-15 ENCOUNTER — Ambulatory Visit: Payer: BC Managed Care – PPO | Attending: Gastroenterology | Admitting: Physical Therapy

## 2021-11-15 DIAGNOSIS — M6281 Muscle weakness (generalized): Secondary | ICD-10-CM | POA: Insufficient documentation

## 2021-11-15 DIAGNOSIS — R279 Unspecified lack of coordination: Secondary | ICD-10-CM | POA: Insufficient documentation

## 2022-03-13 IMAGING — CT CT ABD-PELV W/ CM
2 of 4 series · 17 of 46 positions shown, 19 images · IV contrast (OMNIPAQUE 300)
Comparison: None.

CLINICAL DATA: Newly diagnosed colon carcinoma by colonoscopy.
Staging.

EXAM:
CT ABDOMEN AND PELVIS WITH CONTRAST
TECHNIQUE: Multidetector CT imaging of the abdomen and pelvis was performed
using the standard protocol following bolus administration of
intravenous contrast.
CONTRAST:  100mL OMNIPAQUE IOHEXOL 300 MG/ML  SOLN

[Series 2: abd/pel w · axial · 0.74mm/px · z∈[-515,-80]mm · 14 of 95 slices shown, 16 images]
[im 4/95  soft-tissue]
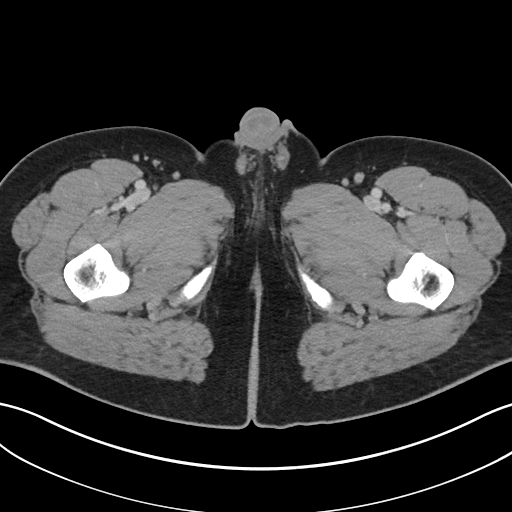
[im 4/95  bone]
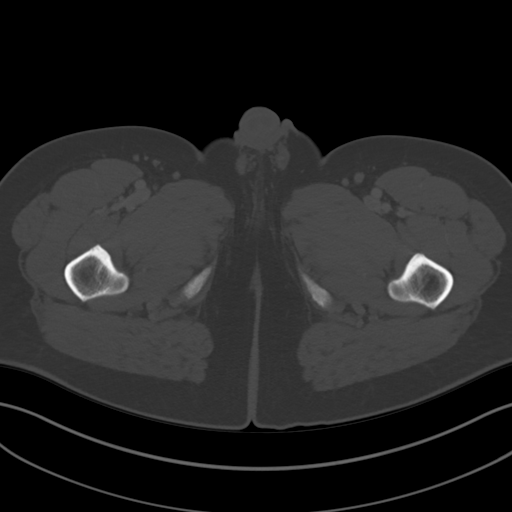
[im 12/95  soft-tissue]
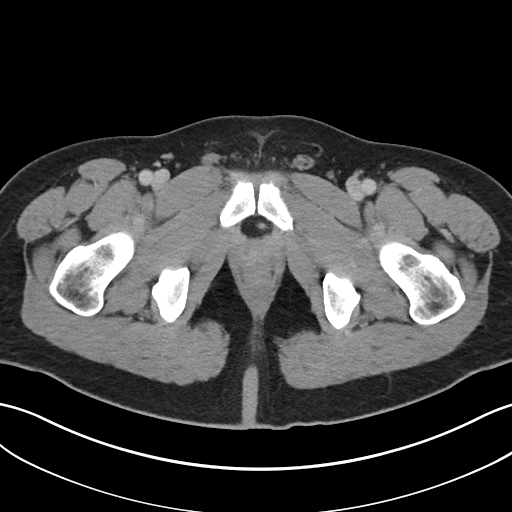
[im 19/95  soft-tissue]
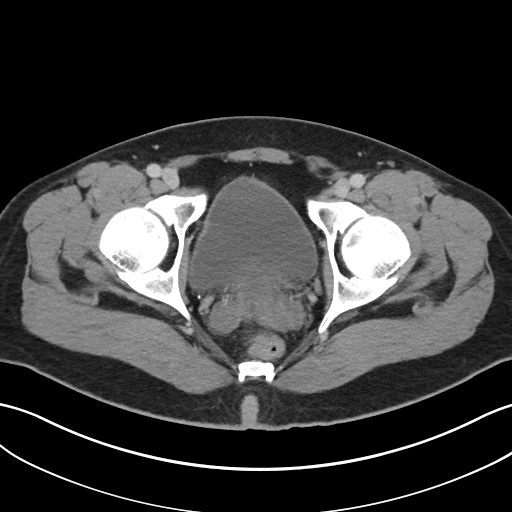
[im 27/95  soft-tissue]
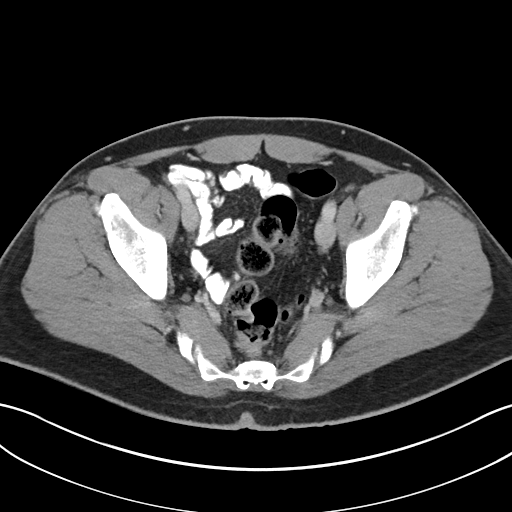
[im 31/95  soft-tissue]
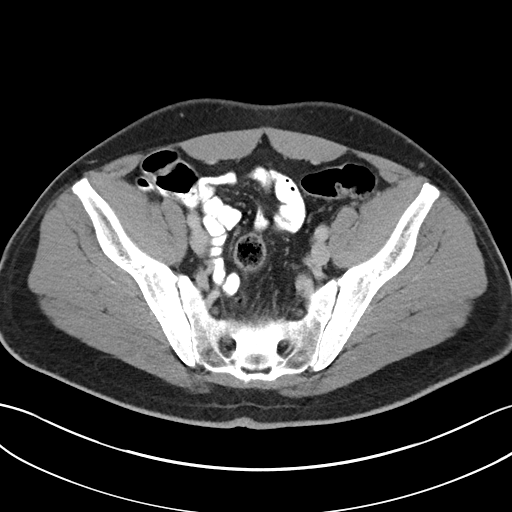
[im 38/95  soft-tissue]
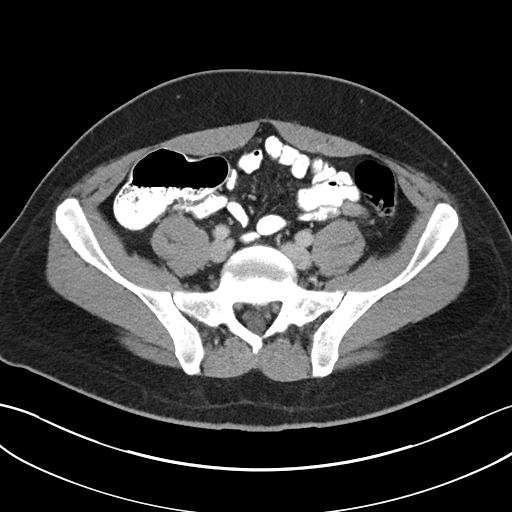
[im 46/95  soft-tissue]
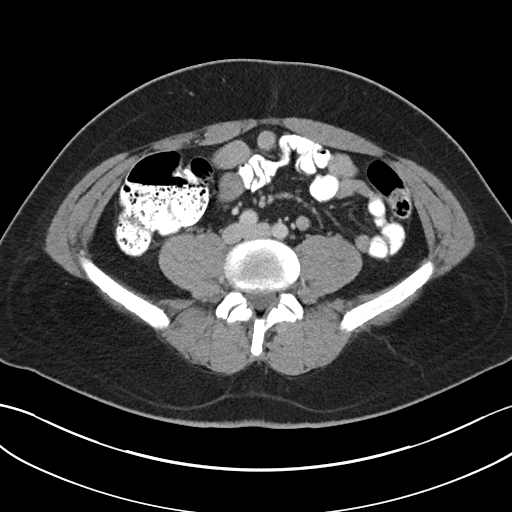
[im 49/95  soft-tissue]
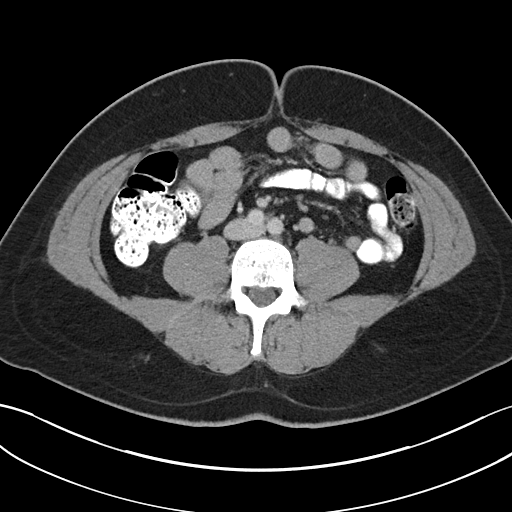
[im 57/95  soft-tissue]
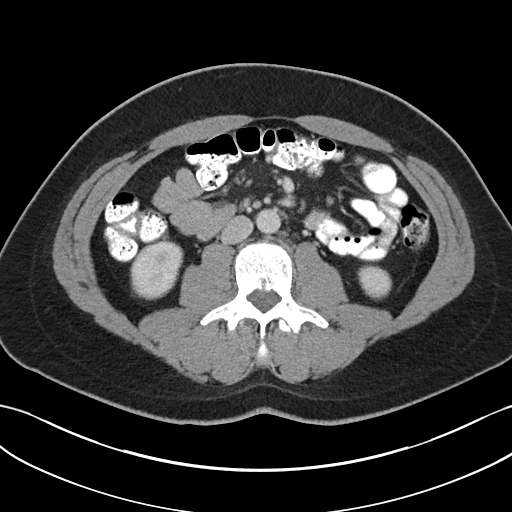
[im 57/95  bone]
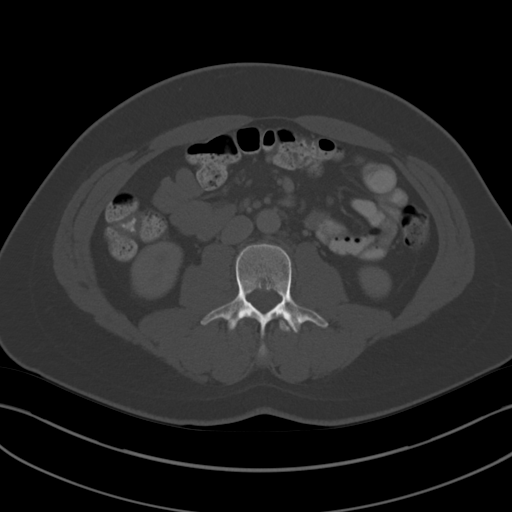
[im 64/95  soft-tissue]
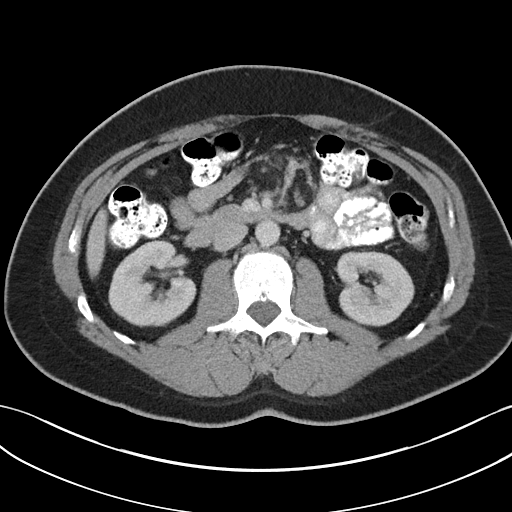
[im 72/95  soft-tissue]
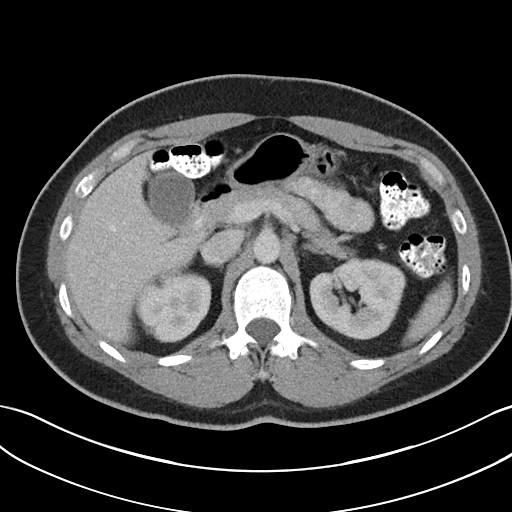
[im 76/95  soft-tissue]
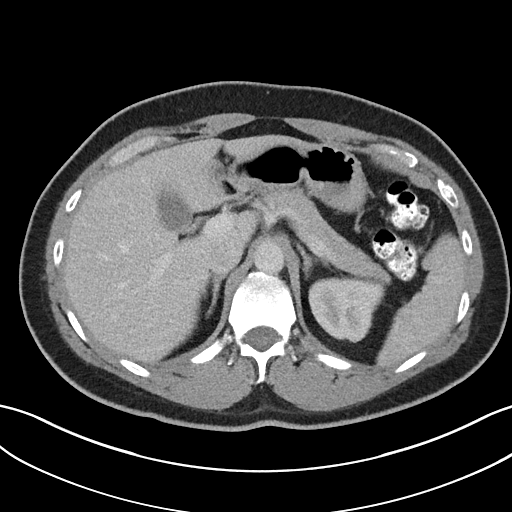
[im 83/95  soft-tissue]
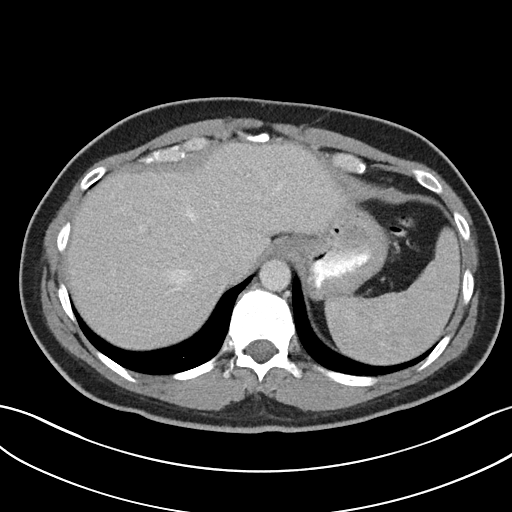
[im 91/95  soft-tissue]
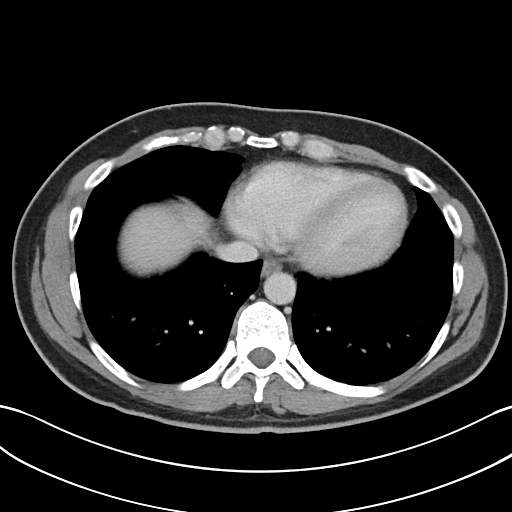

[Series 5: abd/pel w st · coronal · 0.73mm/px · 3 of 90 slices shown]
[im 30/90  soft-tissue]
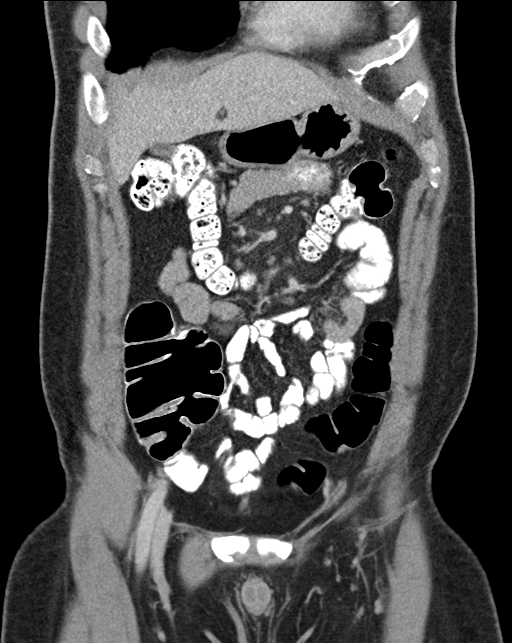
[im 40/90  soft-tissue]
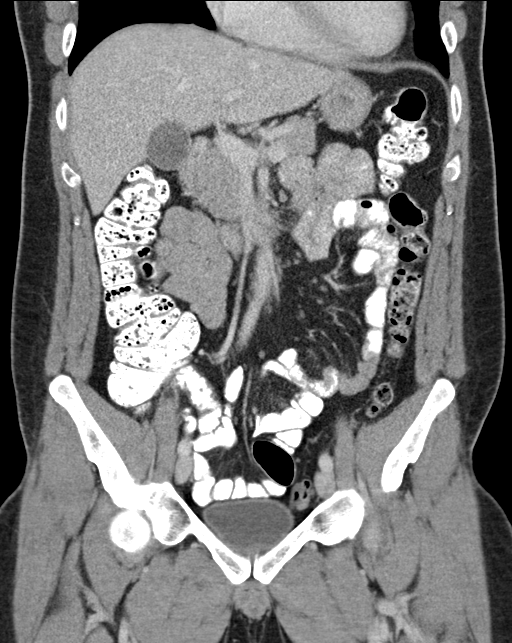
[im 50/90  soft-tissue]
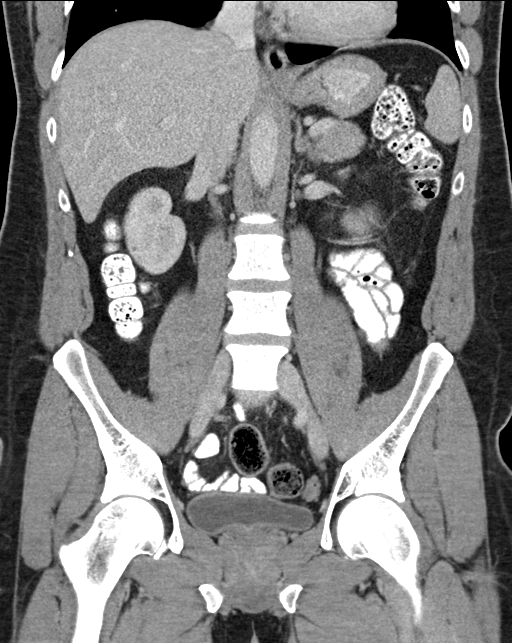

[17 of 46 positions shown; findings below may reference images not displayed]

FINDINGS: Lower Chest: No acute findings.

Hepatobiliary: No hepatic masses identified. Gallbladder is
unremarkable. No evidence of biliary ductal dilatation.

Pancreas:  No mass or inflammatory changes.

Spleen: Within normal limits in size and appearance.

Adrenals/Urinary Tract: No masses identified. No evidence of
ureteral calculi or hydronephrosis.

Stomach/Bowel: No evidence of obstruction, inflammatory process or
abnormal fluid collections. No evidence of wall thickening or mass.

Vascular/Lymphatic: No pathologically enlarged lymph nodes. No
abdominal aortic aneurysm.

Reproductive:  No mass or other significant abnormality.

Other:  None.

Musculoskeletal:  No suspicious bone lesions identified.
IMPRESSION: Negative. No evidence of metastatic disease or other significant
abnormality.

## 2022-04-05 IMAGING — CT CT CHEST W/ CM
1 series · 16 of 34 positions shown, 20 images · IV contrast (APPLIED)
Comparison: Abdominal CT 03/30/2020

CLINICAL DATA: No diagnosed colon cancer by colonoscopy.

EXAM:
CT CHEST WITH CONTRAST
TECHNIQUE: Multidetector CT imaging of the chest was performed during
intravenous contrast administration.
CONTRAST:  75mL RD327P-000 IOPAMIDOL (RD327P-000) INJECTION 61%

[Series 2: chest w/cm · axial · 0.72mm/px · z∈[-362,-60]mm · 16 of 171 slices shown, 20 images]
[im 13/171  mediastinal]
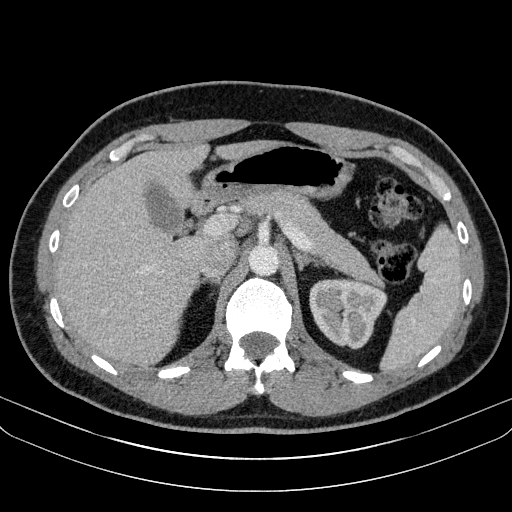
[im 13/171  lung]
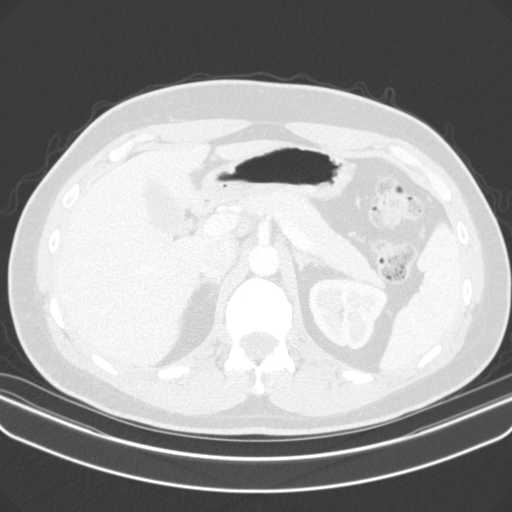
[im 26/171  lung]
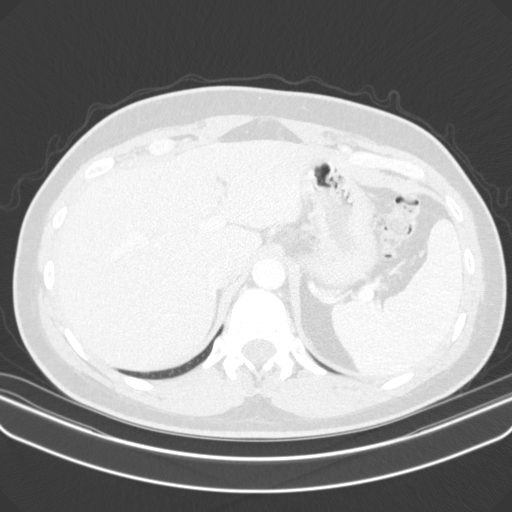
[im 35/171  lung]
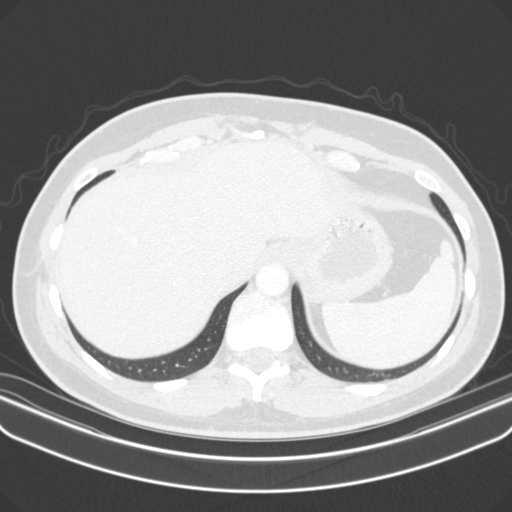
[im 45/171  lung]
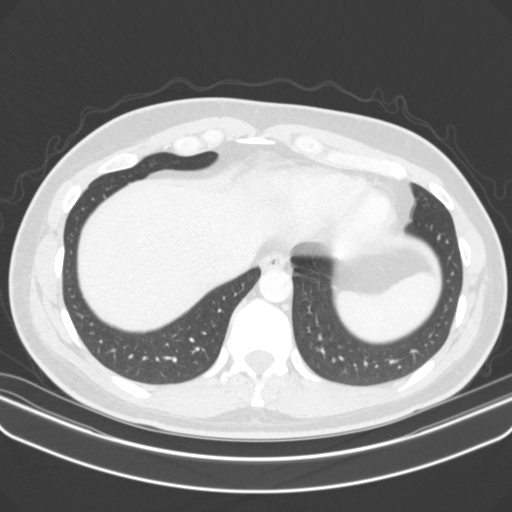
[im 57/171  mediastinal]
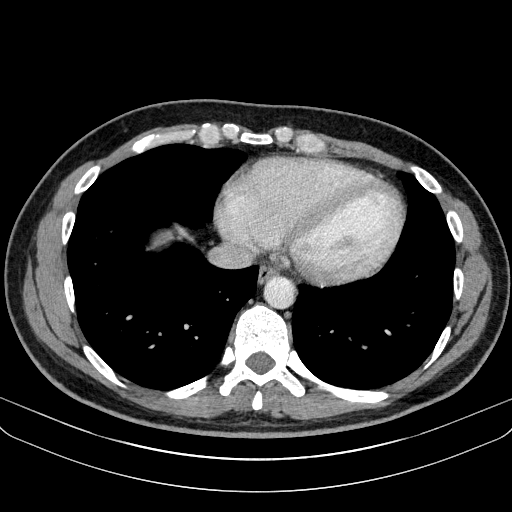
[im 57/171  lung]
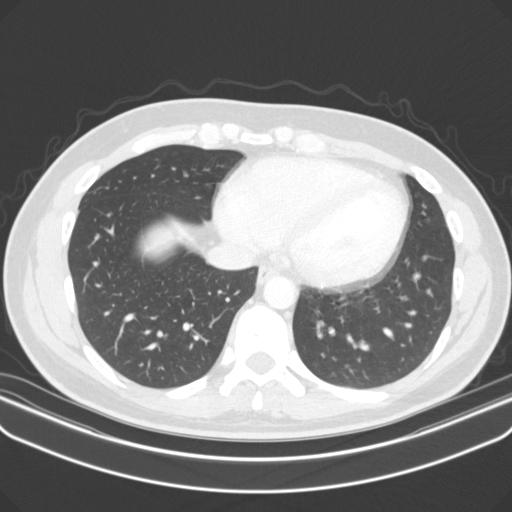
[im 69/171  lung]
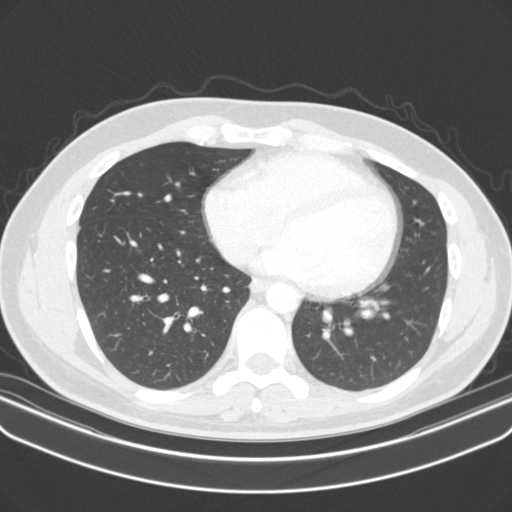
[im 76/171  lung]
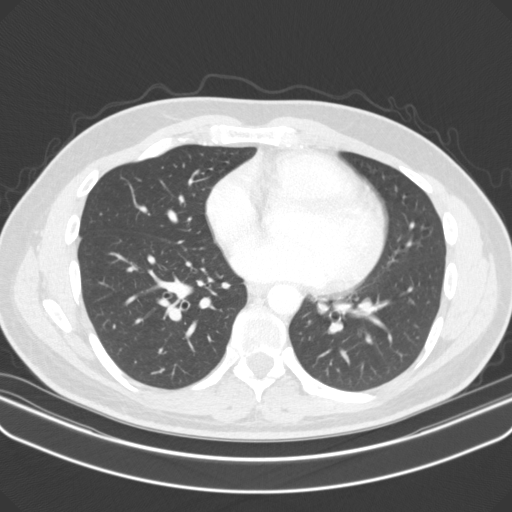
[im 82/171  lung]
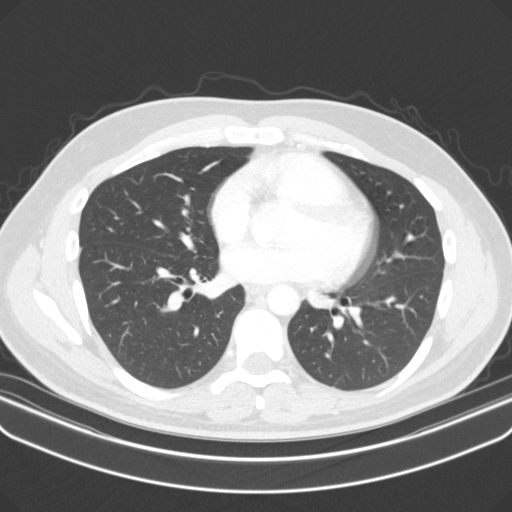
[im 91/171  mediastinal]
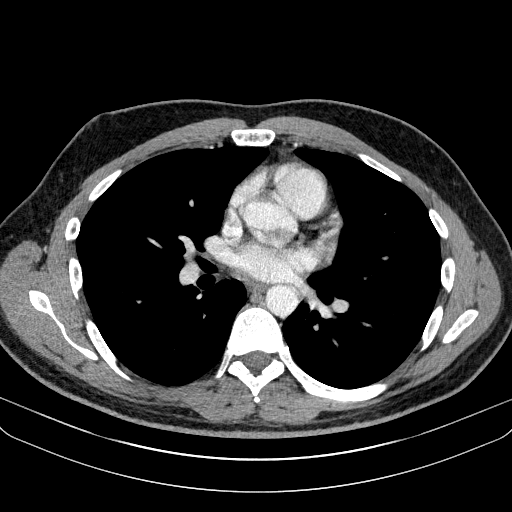
[im 91/171  lung]
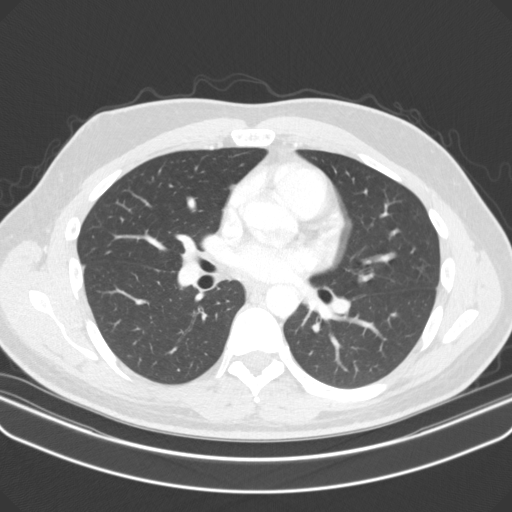
[im 101/171  lung]
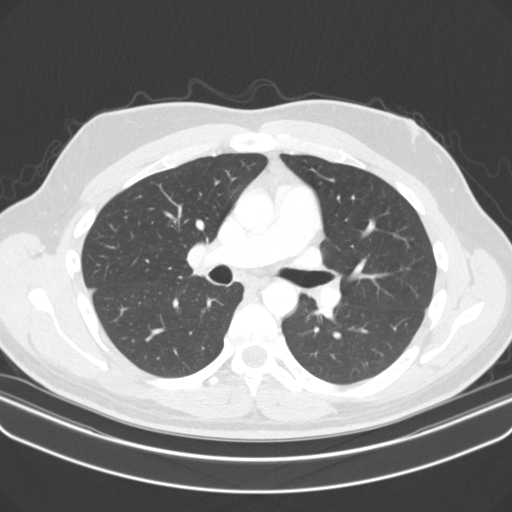
[im 108/171  lung]
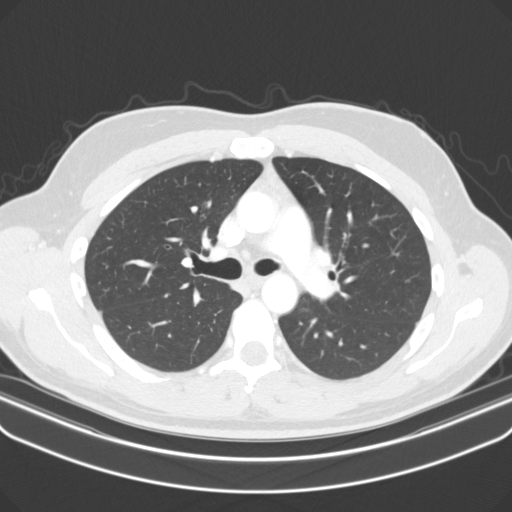
[im 120/171  lung]
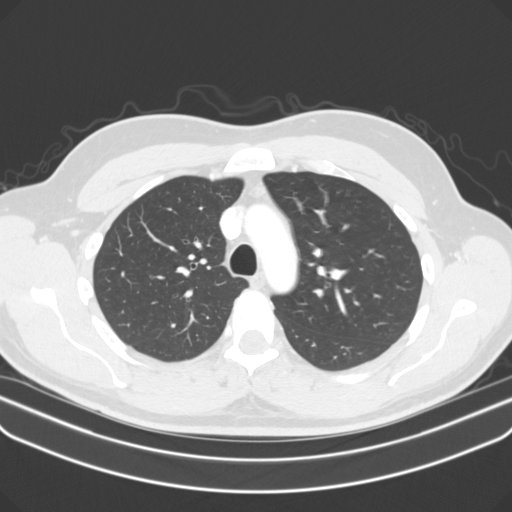
[im 133/171  mediastinal]
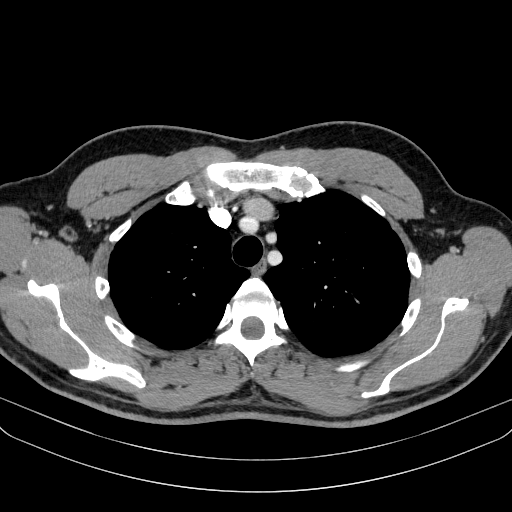
[im 133/171  lung]
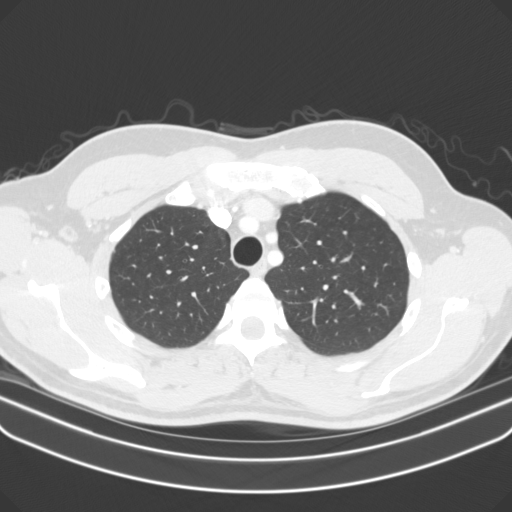
[im 139/171  lung]
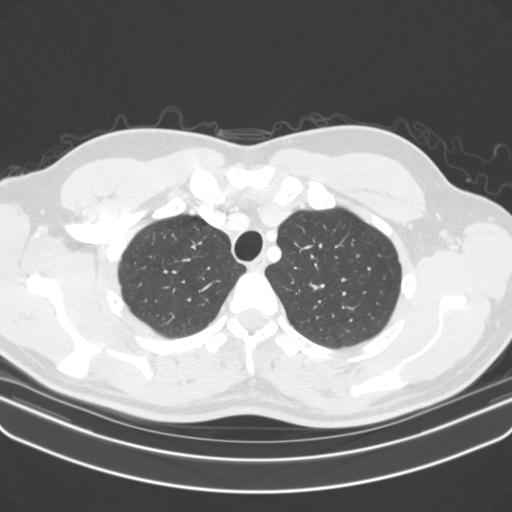
[im 152/171  lung]
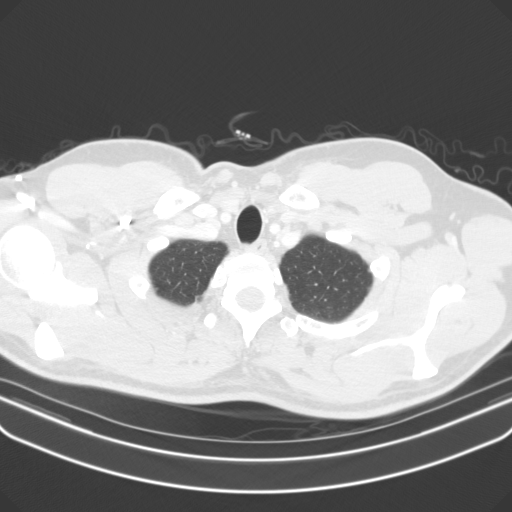
[im 164/171  lung]
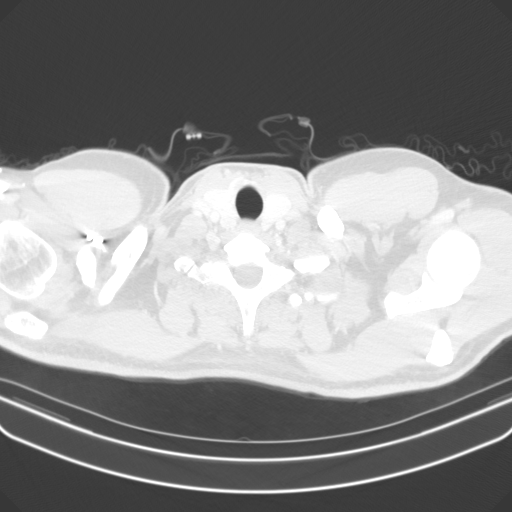

[16 of 34 positions shown; findings below may reference images not displayed]

FINDINGS: Cardiovascular: Normal aortic caliber. Normal heart size, without
pericardial effusion. No central pulmonary embolism, on this
non-dedicated study.

Mediastinum/Nodes: No supraclavicular adenopathy. No mediastinal or
hilar adenopathy. Minimal residual thymic tissue in the anterior
mediastinum.

Lungs/Pleura: No pleural fluid. Clear lungs.

Upper Abdomen: Normal imaged portions of the liver, spleen, stomach,
pancreas, gallbladder, adrenal glands, kidneys.

Musculoskeletal: No acute osseous abnormality.
IMPRESSION: No acute process or evidence of metastatic disease in the chest.

## 2023-05-22 ENCOUNTER — Other Ambulatory Visit: Payer: Self-pay | Admitting: Registered Nurse

## 2023-05-22 ENCOUNTER — Ambulatory Visit
Admission: RE | Admit: 2023-05-22 | Discharge: 2023-05-22 | Disposition: A | Discharge status: Home / Self Care | Payer: BC Managed Care – PPO | Source: Ambulatory Visit | Attending: Registered Nurse | Admitting: Registered Nurse

## 2023-05-22 DIAGNOSIS — R229 Localized swelling, mass and lump, unspecified: Secondary | ICD-10-CM

## 2023-05-22 MED ORDER — IOPAMIDOL (ISOVUE-300) INJECTION 61%
75.0000 mL | Freq: Once | INTRAVENOUS | Status: AC | PRN
  Administered 2023-05-22: 75 mL via INTRAVENOUS

## 2024-07-02 ENCOUNTER — Encounter: Payer: Self-pay | Admitting: Gastroenterology

## 2024-07-21 ENCOUNTER — Ambulatory Visit (AMBULATORY_SURGERY_CENTER): Payer: Self-pay

## 2024-07-21 ENCOUNTER — Encounter: Payer: Self-pay | Admitting: Gastroenterology

## 2024-07-21 VITALS — Ht 73.0 in | Wt 220.0 lb

## 2024-07-21 DIAGNOSIS — Z860100 Personal history of colon polyps, unspecified: Secondary | ICD-10-CM

## 2024-07-21 DIAGNOSIS — Z85038 Personal history of other malignant neoplasm of large intestine: Secondary | ICD-10-CM

## 2024-07-21 MED ORDER — NA SULFATE-K SULFATE-MG SULF 17.5-3.13-1.6 GM/177ML PO SOLN: 1.0000 | Freq: Once | ORAL | 0 refills | Status: AC

## 2024-07-21 NOTE — Progress Notes (Signed)

## 2024-08-06 ENCOUNTER — Ambulatory Visit (AMBULATORY_SURGERY_CENTER): Payer: Self-pay | Admitting: Gastroenterology

## 2024-08-06 ENCOUNTER — Encounter: Payer: Self-pay | Admitting: Gastroenterology

## 2024-08-06 VITALS — BP 112/73 | HR 85 | Temp 98.5°F | Resp 12 | Ht 73.0 in | Wt 220.0 lb

## 2024-08-06 DIAGNOSIS — K648 Other hemorrhoids: Secondary | ICD-10-CM

## 2024-08-06 DIAGNOSIS — Z980 Intestinal bypass and anastomosis status: Secondary | ICD-10-CM | POA: Diagnosis not present

## 2024-08-06 DIAGNOSIS — Z85038 Personal history of other malignant neoplasm of large intestine: Secondary | ICD-10-CM | POA: Diagnosis not present

## 2024-08-06 DIAGNOSIS — Z860100 Personal history of colon polyps, unspecified: Secondary | ICD-10-CM

## 2024-08-06 DIAGNOSIS — Z860101 Personal history of adenomatous and serrated colon polyps: Secondary | ICD-10-CM | POA: Diagnosis not present

## 2024-08-06 DIAGNOSIS — K5520 Angiodysplasia of colon without hemorrhage: Secondary | ICD-10-CM | POA: Diagnosis not present

## 2024-08-06 DIAGNOSIS — Z1211 Encounter for screening for malignant neoplasm of colon: Secondary | ICD-10-CM | POA: Diagnosis present

## 2024-08-06 DIAGNOSIS — K640 First degree hemorrhoids: Secondary | ICD-10-CM

## 2024-08-06 MED ORDER — SODIUM CHLORIDE 0.9 % IV SOLN: 500.0000 mL | INTRAVENOUS | Status: DC

## 2024-08-06 NOTE — Op Note (Signed)
 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 Sister with colon                            polyps.                           Last colonoscopy was 08/2021 and notable for 1 mm                            proximal ascending colon sessile serrated polyp,                            healthy anastamosis, with recommendation to repeat                            in 3 years. Medicines:                Monitored Anesthesia Care Procedure:                Pre-Anesthesia Assessment:                           - Prior to the procedure, a History and Physical                            was performed, and patient medications and                            allergies were reviewed. The patient's tolerance of                            previous anesthesia was also reviewed. The risks                            and benefits of the procedure and the sedation                            options and risks were discussed with the patient.                            All questions  were answered, and informed consent                            was obtained. Prior Anticoagulants: The patient has                            taken no anticoagulant or antiplatelet agents. ASA                            Grade Assessment: III - A patient with severe                            systemic disease. After reviewing the risks and                            benefits, the patient was deemed in satisfactory                            condition to undergo the  procedure.                           After obtaining informed consent, the colonoscope                            was passed under direct vision. Throughout the                            procedure, the patient's blood pressure, pulse, and                            oxygen saturations were monitored continuously. The                            Olympus Scope [id number] was introduced through the                            anus and advanced to the the terminal ileum. The                            colonoscopy was performed without difficulty. The                            patient tolerated the procedure well. The quality                            of the bowel preparation was good. The terminal                            ileum, ileocecal valve, appendiceal orifice, and                            rectum were photographed. Scope In: 10:10:44 AM Scope Out: 10:26:48 AM Scope Withdrawal Time: 0 hours 12 minutes 47 seconds  Total Procedure Duration: 0 hours  16 minutes 4 seconds  Findings:                 The perianal and digital rectal examinations were                            normal.                           There was evidence of a prior end-to-end                            colo-colonic anastomosis in the sigmoid colon,                            located approximately 25 cm from the anal verge.                            This was patent and was characterized by healthy                            appearing mucosa. The anastomosis was traversed.                           A few small, non-bleeding angioectasias with                            typical arborization were found in the sigmoid                            colon, in the descending colon, in the ascending                            colon, and in the cecum.                           Non-bleeding internal hemorrhoids were found during                            retroflexion. The hemorrhoids were small.                           The  terminal ileum appeared normal. Complications:            No immediate complications. Estimated Blood Loss:     Estimated blood loss: none. Impression:               - Patent end-to-end colo-colonic anastomosis,                            characterized by healthy appearing mucosa.                           - A few colonic angioectasias.                           - Non-bleeding internal hemorrhoids.                           -  The examined portion of the ileum was normal.                           - No specimens collected. Recommendation:           - Patient has a contact number available for                            emergencies. The signs and symptoms of potential                            delayed complications were discussed with the                            patient. Return to normal activities tomorrow.                            Written discharge instructions were provided to the                            patient.                           - Resume previous diet.                           - Continue present medications.                           - Repeat colonoscopy in 5 years for surveillance.                           - Return to GI office PRN. Sandor Flatter, MD 08/06/2024 10:34:23 AM

## 2024-08-06 NOTE — Patient Instructions (Addendum)
 Resume previous diet.                           - Continue present medications.                           - Repeat colonoscopy in 5 years for surveillance.                           - Return to GI office PRN.     YOU HAD AN ENDOSCOPIC PROCEDURE TODAY AT THE [facility]:   Refer to the procedure report that was given to you for any specific questions about what was found during the examination.  If the procedure report does not answer your questions, please call your gastroenterologist to clarify.  If you requested that your care partner not be given the details of your procedure findings, then the procedure report has been included in a sealed envelope for you to review at your convenience later.  YOU SHOULD EXPECT: Some feelings of bloating in the abdomen. Passage of more gas than usual.  Walking can help get rid of the air that was put into your GI tract during the procedure and reduce the bloating. If you had a lower endoscopy (such as a colonoscopy or flexible sigmoidoscopy) you may notice spotting of blood in your stool or on the toilet paper. If you underwent a bowel prep for your procedure, you may not have a normal bowel movement for a few days.  Please Note:  You might notice some irritation and congestion in your nose or some drainage.  This is from the oxygen used during your procedure.  There is no need for concern and it should clear up in a day or so.  SYMPTOMS TO REPORT IMMEDIATELY:  Following lower endoscopy (colonoscopy or flexible sigmoidoscopy):  Excessive amounts of blood in the stool  Significant tenderness or worsening of abdominal pains  Swelling of the abdomen that is new, acute  Fever of 100F or higher   For urgent or emergent issues, a gastroenterologist can be reached at any hour by calling [phone]. Do not use MyChart messaging for urgent concerns.    DIET:  We do recommend a small meal at first, but then you may proceed to your regular  diet.  Drink plenty of fluids but you should avoid alcoholic beverages for 24 hours.  ACTIVITY:  You should plan to take it easy for the rest of today and you should NOT DRIVE or use heavy machinery until tomorrow (because of the sedation medicines used during the test).    FOLLOW UP: Our staff will call the number listed on your records the next business day following your procedure.  We will call around 7:15- 8:00 am to check on you and address any questions or concerns that you may have regarding the information given to you following your procedure. If we do not reach you, we will leave a message.     If any biopsies were taken you will be contacted by phone or by letter within the next 1-3 weeks.  Please call us  at [phone] if you have not heard about the biopsies in 3 weeks.    SIGNATURES/CONFIDENTIALITY: You and/or your care partner have signed paperwork which will be entered into your electronic medical record.  These signatures attest to the fact that that the  information above on your After Visit Summary has been reviewed and is understood.  Full responsibility of the confidentiality of this discharge information lies with you and/or your care-partner.

## 2024-08-06 NOTE — Progress Notes (Signed)
 Pt's states no medical or surgical changes since previsit or office visit.

## 2024-08-06 NOTE — Progress Notes (Signed)
 Sedate, gd SR, tolerated procedure well, VSS, report to RN

## 2024-08-06 NOTE — Progress Notes (Signed)
 GASTROENTEROLOGY PROCEDURE H&P NOTE   Primary Care Physician: Onita Rush, MD    Reason for Procedure:  Colon cancer surveillance  Plan:    Colonoscopy  Patient is appropriate for endoscopic procedure(s) in the ambulatory ([facility]) setting.  The nature of the procedure, as well as the risks, benefits, and alternatives were carefully and thoroughly reviewed with the patient. Ample time for discussion and questions allowed. The patient understood, was satisfied, and agreed to proceed.     HPI: Thomas Hunt is a 43 y.o. male who presents for colonoscopy for ongoing colon cancer surveillance and colon cancer screening.  No active GI symptoms.    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 Sister with colon polyps.   Last colonoscopy was 08/2021 and notable for 1 mm proximal ascending colon sessile serrated polyp, healthy anastamosis, with recommendation to repeat in 3 years.   Past Medical History:  Diagnosis Date   Anxiety    Colon cancer (HCC)    Family history of colon cancer    Family history of ovarian cancer    Family history of prostate cancer    Flat feet    Hyperlipidemia    Midline thoracic back pain    Plantar warts    Post-nasal drip     Past Surgical History:  Procedure Laterality Date   COLON SURGERY     COLONOSCOPY     SIGMOIDOSCOPY  03/26/2020   TONSILLECTOMY AND ADENOIDECTOMY      Prior to Admission medications   Medication Sig Start Date End Date Taking? Authorizing Provider  ibuprofen  (ADVIL ) 200 MG tablet Take 200 mg by mouth every 6 (six) hours as needed.    [provider]    Current Outpatient Medications  Medication Sig Dispense Refill   ibuprofen  (ADVIL ) 200 MG tablet Take 200 mg by mouth every 6 (six) hours as needed.     Current Facility-Administered Medications  Medication Dose Route Frequency Provider Last Rate Last Admin   0.9 %  sodium chloride  infusion  500 mL Intravenous Continuous  Cheralyn Oliver V, DO        Allergies as of 08/06/2024   (No Known Allergies)    Family History  Problem Relation Age of Onset   Colonic polyp Mother    Hypertension Father    Prostate cancer Father 90   Prostate cancer Maternal Grandfather 10   Lung cancer Maternal Grandfather 69       smoker   Ovarian cancer Paternal Grandmother        fallopian tube cancer   Prostate cancer Paternal Grandfather        d. 68   Colon cancer Other        MGF's brother dx over 9   Stomach cancer Neg Hx    Rectal cancer Neg Hx    Esophageal cancer Neg Hx     Social History   Socioeconomic History   Marital status: Married    Spouse name: Not on file   Number of children: Not on file   Years of education: Not on file   Highest education level: Not on file  Occupational History   Not on file  Tobacco Use   Smoking status: Never   Smokeless tobacco: Never  Vaping Use   Vaping status: Never Used  Substance and Sexual Activity   Alcohol use: Yes    Comment: occasionally   Drug use: Never   Sexual activity: Not on file  Other Topics Concern  Not on file  Social History Narrative   Not on file   Social Drivers of Health   Financial Resource Strain: Not on file  Food Insecurity: Not on file  Transportation Needs: Not on file  Physical Activity: Not on file  Stress: Not on file  Social Connections: Not on file  Intimate Partner Violence: Not on file    Physical Exam: Vital signs in last 24 hours: @BP  (!) 151/77   Pulse (!) 108   Temp 98.5 F (36.9 C)   Ht 6' 1 (1.854 m)   Wt 220 lb (99.8 kg)   SpO2 98%   BMI 29.03 kg/m  GEN: NAD EYE: Sclerae anicteric ENT: MMM CV: Non-tachycardic Pulm: CTA b/l GI: Soft, NT/ND NEURO:  Alert & Oriented x 3   Sandor Flatter, DO [facility]   08/06/2024 9:56 AM

## 2024-08-07 ENCOUNTER — Telehealth: Payer: Self-pay | Admitting: Lactation Services

## 2024-08-07 NOTE — Telephone Encounter (Signed)
  Follow up Call-     08/06/2024    9:49 AM  Call back number  Post procedure Call Back phone  # [phone]  Permission to leave phone message Yes     Patient questions:  Do you have a fever, pain , or abdominal swelling? No. Pain Score  0 *  Have you tolerated food without any problems? Yes.    Have you been able to return to your normal activities? Yes.    Do you have any questions about your discharge instructions: Diet   No. Medications  No. Follow up visit  No.  Do you have questions or concerns about your Care? No.  Actions: * If pain score is 4 or above: No action needed, pain <4.

## 2025-01-01 ENCOUNTER — Other Ambulatory Visit: Payer: Self-pay | Admitting: Internal Medicine

## 2025-01-01 DIAGNOSIS — E785 Hyperlipidemia, unspecified: Secondary | ICD-10-CM

## 2025-01-22 ENCOUNTER — Other Ambulatory Visit
